# Patient Record
Sex: Male | Born: 1965 | Race: White | Hispanic: No | Marital: Married | Smoking: Never smoker
Health system: Southern US, Community
[De-identification: ages and names within clinical notes are randomized; demographics above are authoritative.]

## PROBLEM LIST (undated history)

## (undated) DIAGNOSIS — Z950 Presence of cardiac pacemaker: Secondary | ICD-10-CM

## (undated) DIAGNOSIS — N200 Calculus of kidney: Secondary | ICD-10-CM

## (undated) DIAGNOSIS — Z87442 Personal history of urinary calculi: Secondary | ICD-10-CM

## (undated) DIAGNOSIS — M545 Low back pain, unspecified: Secondary | ICD-10-CM

## (undated) DIAGNOSIS — Z8489 Family history of other specified conditions: Secondary | ICD-10-CM

## (undated) DIAGNOSIS — G8929 Other chronic pain: Secondary | ICD-10-CM

## (undated) DIAGNOSIS — R569 Unspecified convulsions: Secondary | ICD-10-CM

## (undated) DIAGNOSIS — R0609 Other forms of dyspnea: Secondary | ICD-10-CM

## (undated) DIAGNOSIS — R55 Syncope and collapse: Secondary | ICD-10-CM

## (undated) DIAGNOSIS — R0600 Dyspnea, unspecified: Secondary | ICD-10-CM

## (undated) DIAGNOSIS — R001 Bradycardia, unspecified: Secondary | ICD-10-CM

## (undated) HISTORY — DX: Other forms of dyspnea: R06.09

## (undated) HISTORY — DX: Presence of cardiac pacemaker: Z95.0

## (undated) HISTORY — DX: Unspecified convulsions: R56.9

## (undated) HISTORY — DX: Calculus of kidney: N20.0

## (undated) HISTORY — PX: BACK SURGERY: SHX140

## (undated) HISTORY — DX: Bradycardia, unspecified: R00.1

## (undated) HISTORY — DX: Syncope and collapse: R55

## (undated) HISTORY — PX: LUMBAR DISC SURGERY: SHX700

## (undated) HISTORY — DX: Dyspnea, unspecified: R06.00

## (undated) HISTORY — PX: TONSILLECTOMY AND ADENOIDECTOMY: SUR1326

---

## 2013-05-20 DIAGNOSIS — R209 Unspecified disturbances of skin sensation: Secondary | ICD-10-CM

## 2013-05-21 ENCOUNTER — Encounter: Payer: Self-pay | Admitting: Cardiology

## 2013-05-21 DIAGNOSIS — R079 Chest pain, unspecified: Secondary | ICD-10-CM

## 2013-07-07 ENCOUNTER — Encounter: Payer: Self-pay | Admitting: Pulmonary Disease

## 2013-07-07 ENCOUNTER — Ambulatory Visit (INDEPENDENT_AMBULATORY_CARE_PROVIDER_SITE_OTHER): Payer: Managed Care, Other (non HMO) | Admitting: Pulmonary Disease

## 2013-07-07 VITALS — BP 122/70 | HR 44 | Temp 97.0°F | Ht 69.0 in | Wt 154.2 lb

## 2013-07-07 DIAGNOSIS — R0609 Other forms of dyspnea: Secondary | ICD-10-CM

## 2013-07-07 DIAGNOSIS — R0600 Dyspnea, unspecified: Secondary | ICD-10-CM

## 2013-07-07 NOTE — Patient Instructions (Addendum)
Will schedule for breathing studies.   Will get records from [facility] regarding your recent hospitalization. followup with me the same day as breathing studies.

## 2013-07-07 NOTE — Progress Notes (Signed)
  Subjective:    Patient ID: John [name], male    DOB: 25-May-1966, 47 y.o.   MRN: [id number]  HPI The patient is a 47 year old male who I've been asked to see for dyspnea.  He tells me that he has had mild shortness of breath with activity in the past, but the last 3 months he has had progressive dyspnea on exertion that can now occur at rest.  He notes that he can become short of breath just talking at times.  He relates a one block dyspnea on exertion at a moderate pace on flat ground, and will get winded bringing groceries in from the car or carrying any type of loads.  He thinks he can walk up one flight of stairs without becoming winded.  The patient has never smoked, and has no history of asthma in the past.  He denies any cough, mucus, and no lower extremity edema.  He had a chest x-ray in August of this year that was completely normal, and has had no recent pulmonary function studies.  He did have a recent cardiac workup with what sounds like a chemical stress test.  He tells me that he had great difficulty getting his heart rate up, and then it would race and have a difficult time coming down.  He has never had a cardiac catheterization.  He describes proximal muscle weakness in his upper extremities, but has no difficulties with his lower extremities.  A recent TSH and hemoglobin are not available to me currently.   Review of Systems  Constitutional: Negative for fever and unexpected weight change.  HENT: Negative for ear pain, nosebleeds, congestion, sore throat, rhinorrhea, sneezing, trouble swallowing, dental problem, postnasal drip and sinus pressure.   Eyes: Negative for redness and itching.  Respiratory: Positive for shortness of breath. Negative for cough, chest tightness and wheezing.   Cardiovascular: Negative for palpitations and leg swelling.  Gastrointestinal: Negative for nausea and vomiting.  Genitourinary: Negative for dysuria.  Musculoskeletal: Negative for joint swelling.   Skin: Negative for rash.  Neurological: Negative for headaches.  Hematological: Does not bruise/bleed easily.  Psychiatric/Behavioral: Negative for dysphoric mood. The patient is not nervous/anxious.        Objective:   Physical Exam Constitutional:  Well developed, no acute distress  HENT:  Nares patent without discharge, septal deviation to the right  Oropharynx without exudate, palate and uvula are normal  Eyes:  Perrla, eomi, no scleral icterus  Neck:  No JVD, no TMG  Cardiovascular:  bradycardia, regular rhythm, no rubs or gallops.  No murmurs        Intact distal pulses  Pulmonary :  Normal breath sounds, no stridor or respiratory distress   No rales, rhonchi, or wheezing  Abdominal:  Soft, nondistended, bowel sounds present.  No tenderness noted.   Musculoskeletal:  No lower extremity edema noted.  Lymph Nodes:  No cervical lymphadenopathy noted  Skin:  No cyanosis noted  Neurologic:  Alert, appropriate, moves all 4 extremities without obvious deficit.         Assessment & Plan:

## 2013-07-08 NOTE — Assessment & Plan Note (Signed)
The patient is complaining of dyspnea with exertion and at rest, however there is nothing from a pulmonary standpoint that is obvious by history and exam today.  His lungs were totally clear, and his recent chest x-ray is normal.  He has never smoked, and has no history of asthma.  He will obviously need full pulmonary function studies for evaluation, and I will also check muscle pressures to evaluate for possible neuromuscular weakness.  My only other thought is whether he could have chronotropic incompetence with his significant bradycardia and difficulty increasing his heart rate during stress testing.  I will leave that to his primary physician to consider.

## 2013-07-12 ENCOUNTER — Encounter: Payer: Self-pay | Admitting: Pulmonary Disease

## 2013-07-12 ENCOUNTER — Ambulatory Visit (INDEPENDENT_AMBULATORY_CARE_PROVIDER_SITE_OTHER): Payer: Managed Care, Other (non HMO) | Admitting: Pulmonary Disease

## 2013-07-12 ENCOUNTER — Ambulatory Visit (HOSPITAL_COMMUNITY)
Admission: RE | Admit: 2013-07-12 | Discharge: 2013-07-12 | Disposition: A | Discharge status: Home / Self Care | Payer: Managed Care, Other (non HMO) | Source: Ambulatory Visit | Attending: Pulmonary Disease | Admitting: Pulmonary Disease

## 2013-07-12 VITALS — BP 100/60 | HR 54 | Temp 97.4°F | Ht 68.0 in | Wt 154.0 lb

## 2013-07-12 DIAGNOSIS — R0600 Dyspnea, unspecified: Secondary | ICD-10-CM

## 2013-07-12 DIAGNOSIS — R0609 Other forms of dyspnea: Secondary | ICD-10-CM | POA: Insufficient documentation

## 2013-07-12 DIAGNOSIS — R0989 Other specified symptoms and signs involving the circulatory and respiratory systems: Secondary | ICD-10-CM | POA: Insufficient documentation

## 2013-07-12 LAB — PULMONARY FUNCTION TEST

## 2013-07-12 MED ORDER — ALBUTEROL SULFATE (5 MG/ML) 0.5% IN NEBU
2.5000 mg | INHALATION_SOLUTION | Freq: Once | RESPIRATORY_TRACT | Status: AC
  Administered 2013-07-12: 2.5 mg via RESPIRATORY_TRACT

## 2013-07-12 NOTE — Assessment & Plan Note (Signed)
The patient continues to have significant dyspnea on exertion, but his chest x-ray, lung exam, and PFTs are all normal.  I remain concerned about the possibility of chronotropic incompetence, and we'll schedule him for a cardiopulmonary exercise test.  We'll also do muscle pressures, that were not done at the time of his pulmonary function studies.

## 2013-07-12 NOTE — Progress Notes (Signed)
  Subjective:    Patient ID: John [name], male    DOB: June 21, 1966, 47 y.o.   MRN: [id number]  HPI The patient comes in today for followup of his pulmonary function studies, done as part of a workup for dyspnea on exertion.  He was found to have no airflow obstruction, no restriction, and his diffusion capacity was normal.  He did have subtle air trapping on lung volumes.  He continues to have significant bradycardia.  I have had a long discussion with the patient about his PFTs, and answered all of his questions.   Review of Systems  Constitutional: Negative for fever and unexpected weight change.  HENT: Negative for ear pain, nosebleeds, congestion, sore throat, rhinorrhea, sneezing, trouble swallowing, dental problem, postnasal drip and sinus pressure.   Eyes: Negative for redness and itching.  Respiratory: Positive for cough, shortness of breath and wheezing. Negative for chest tightness.   Cardiovascular: Negative for palpitations and leg swelling.  Gastrointestinal: Negative for nausea and vomiting.  Genitourinary: Negative for dysuria.  Musculoskeletal: Negative for joint swelling.  Skin: Negative for rash.  Neurological: Negative for headaches.  Hematological: Does not bruise/bleed easily.  Psychiatric/Behavioral: Negative for dysphoric mood. The patient is not nervous/anxious.        Objective:   Physical Exam Thin male in no acute distress Nose without purulence or discharge noted Neck without lymphadenopathy or thyromegaly Lower extremities without edema, no cyanosis Alert and oriented, moves all 4 extremities.       Assessment & Plan:

## 2013-07-12 NOTE — Patient Instructions (Addendum)
Will schedule for muscle pressures.  I apologize these were not done with your breathing studies. Will schedule for cardiopulmonary exercise testing as we discussed.  Will call when those results are available.

## 2013-07-14 ENCOUNTER — Telehealth: Payer: Self-pay | Admitting: *Deleted

## 2013-07-14 NOTE — Telephone Encounter (Addendum)
Records requested have been received and placed in your green folder to review.

## 2013-07-20 ENCOUNTER — Institutional Professional Consult (permissible substitution): Payer: Managed Care, Other (non HMO) | Admitting: Pulmonary Disease

## 2013-07-21 ENCOUNTER — Ambulatory Visit (HOSPITAL_COMMUNITY): Payer: Managed Care, Other (non HMO) | Attending: Pulmonary Disease

## 2013-07-21 DIAGNOSIS — R0600 Dyspnea, unspecified: Secondary | ICD-10-CM

## 2013-07-21 DIAGNOSIS — R0989 Other specified symptoms and signs involving the circulatory and respiratory systems: Secondary | ICD-10-CM | POA: Insufficient documentation

## 2013-07-21 DIAGNOSIS — R0609 Other forms of dyspnea: Secondary | ICD-10-CM | POA: Insufficient documentation

## 2013-07-24 DIAGNOSIS — R0602 Shortness of breath: Secondary | ICD-10-CM

## 2013-07-26 ENCOUNTER — Other Ambulatory Visit: Payer: Self-pay | Admitting: Pulmonary Disease

## 2013-07-26 ENCOUNTER — Telehealth: Payer: Self-pay | Admitting: Pulmonary Disease

## 2013-07-26 DIAGNOSIS — R0600 Dyspnea, unspecified: Secondary | ICD-10-CM

## 2013-07-26 NOTE — Telephone Encounter (Signed)
PFT results with muscle pressures in green folder.

## 2013-07-26 NOTE — Telephone Encounter (Signed)
Pt aware of results.   Notes Recorded by Barbaraann Share, MD on 07/26/2013 at 10:58 AM Please let pt know that his exercise test did not show any significant pulmonary issue, but the question was raised whether his heart rate was adequate. It was a difficult test because he could not exercise vigorously. Will arrange for the pt to be seen by cardiology. Order put in for him to see Berton Mount. He will be called with this apptm.

## 2013-07-26 NOTE — Telephone Encounter (Signed)
John [name] and I are in the process of talking to John [name] at [facility] whom i spoke with earlier today stating that pt DID have MIP/MEP done.  I spoke with the patient and he states that he went back to [facility] last Wed and had these done again.  We are trying to figure out why they keep sending the previous tests and where the most recent MIP/MEP. Will check in the morning with John [name] and [facility]

## 2013-07-26 NOTE — Telephone Encounter (Signed)
John [name], I saw pfts but no muscle pressures.  This was the pt that we ordered both, but only got pfts.  Last visit, i put in order for muscle pressures only.  He was supposed to go back for these.  Did he ever go?  Thanks.

## 2013-07-28 NOTE — Telephone Encounter (Signed)
Spoke with Almyra Free-- Per Marcelino Duster they did not do MIP/MEP on patient bc they did not see the order placed for MIP/MEP.  Almyra Free has been working with me on trying to get this figured out with [place] at [employer]. Order is in fact in computer for MIP/MEP. Almyra Free is going to contact [place] at [place] in AM to find out what happened.   Will send to [facility] to follow up on. Will send to [facility] as FYI of status.

## 2013-08-02 ENCOUNTER — Encounter (HOSPITAL_COMMUNITY): Payer: Managed Care, Other (non HMO)

## 2013-08-04 ENCOUNTER — Ambulatory Visit (HOSPITAL_COMMUNITY)
Admission: RE | Admit: 2013-08-04 | Discharge: 2013-08-04 | Disposition: A | Discharge status: Home / Self Care | Payer: Managed Care, Other (non HMO) | Source: Ambulatory Visit | Attending: Pulmonary Disease | Admitting: Pulmonary Disease

## 2013-08-04 DIAGNOSIS — R0600 Dyspnea, unspecified: Secondary | ICD-10-CM

## 2013-08-04 DIAGNOSIS — R0989 Other specified symptoms and signs involving the circulatory and respiratory systems: Secondary | ICD-10-CM | POA: Insufficient documentation

## 2013-08-04 DIAGNOSIS — R0609 Other forms of dyspnea: Secondary | ICD-10-CM | POA: Insufficient documentation

## 2013-08-04 LAB — PULMONARY FUNCTION TEST

## 2013-08-06 ENCOUNTER — Encounter: Payer: Self-pay | Admitting: *Deleted

## 2013-08-06 ENCOUNTER — Telehealth: Payer: Self-pay | Admitting: Pulmonary Disease

## 2013-08-06 NOTE — Telephone Encounter (Signed)
Spoke with MW-- Per MW, Pt's lung pressure/muscle pressures are decreased--below normal range.  Patient advised that Dr Shelle Iron will review these results with him in more detail when he returns--Dr Shelle Iron will better be able to relate these results to patients health condition and explain where to go from here with treatment.   Will send to Dr Shelle Iron to follow up on once he returns. PFT MIP/MEP results are in Robbye Dede folder.

## 2013-08-06 NOTE — Telephone Encounter (Signed)
Results have been received. KC out of office until the first week of November.  Will ask Dr Sherene Sires to review PFT to give pt some prelim results until Dr Shelle Iron can follow up with patient when he returns.   Dr Sherene Sires, these PFT are in your look-at. Please advise on what to tell patient since patient requesting results--Dr Clance can review in more detail upon his return. Thanks!

## 2013-08-12 ENCOUNTER — Encounter: Payer: Self-pay | Admitting: Internal Medicine

## 2013-08-12 ENCOUNTER — Ambulatory Visit (INDEPENDENT_AMBULATORY_CARE_PROVIDER_SITE_OTHER): Payer: Managed Care, Other (non HMO) | Admitting: Internal Medicine

## 2013-08-12 VITALS — BP 92/60 | HR 44 | Ht 69.0 in | Wt 156.0 lb

## 2013-08-12 DIAGNOSIS — R001 Bradycardia, unspecified: Secondary | ICD-10-CM

## 2013-08-12 DIAGNOSIS — I4589 Other specified conduction disorders: Secondary | ICD-10-CM

## 2013-08-12 DIAGNOSIS — R0609 Other forms of dyspnea: Secondary | ICD-10-CM

## 2013-08-12 DIAGNOSIS — I498 Other specified cardiac arrhythmias: Secondary | ICD-10-CM

## 2013-08-12 DIAGNOSIS — R0600 Dyspnea, unspecified: Secondary | ICD-10-CM

## 2013-08-12 HISTORY — DX: Bradycardia, unspecified: R00.1

## 2013-08-12 NOTE — Assessment & Plan Note (Signed)
As above.

## 2013-08-12 NOTE — Patient Instructions (Addendum)
Your physician recommends that you continue on your current medications as directed. Please refer to the Current Medication list given to you today.  Please call us next Thursday [place], [place] [phone]  We will determine follow up as we gather information from doctors

## 2013-08-12 NOTE — Progress Notes (Signed)
 ELECTROPHYSIOLOGY CONSULT NOTE  Patient ID: John [name], MRN: [id number], DOB/AGE: 01/13/1966 47 y.o. Admit date: (Not on file) Date of Consult: 08/12/2013  Primary Physician: SASSER,PAUL W, MD Primary Cardiologist:  new  Chief Complaint: edyspnea on exertion   HPI John [name] is a 47 y.o. male   Is seen for dyspnea on exertion. Workup prompted a cardiopulmonary stress test where his peak heart rate was 85 representing only 50% of predicted maximal heart rate.  His dyspnea has been a problem for 6-8 months and has been progressive. It has been accompanied by some chest discomfort.. he has had 2 stress tests [facility]. Apparently they were normal.    He is also been diagnosed with Partridge  Turner syndrome as well as possibly a neuromuscular weakness to his breathing. I tried to get up with Dr. of which will be KC about this and he will get back to me when he returns to [place]      Past Medical History  Diagnosis Date  . Sinus bradycardia 08/12/2013      Surgical History:  Past Surgical History  Procedure Laterality Date  . Back surgery  1992-94    ruptured disc     Home Meds: Prior to Admission medications   Medication Sig Start Date End Date Taking? Authorizing Provider  gabapentin (NEURONTIN) 300 MG capsule 1 capsule 3 (three) times daily. 05/28/13  Yes Historical Provider, MD      Allergies:  Allergies  Allergen Reactions  . Penicillins     RASH    History   Social History  . Marital Status: Single    Spouse Name: N/A    Number of Children: 2  . Years of Education: N/A   Occupational History  . material handler    Social History Main Topics  . Smoking status: Never Smoker   . Smokeless tobacco: Never Used  . Alcohol Use: Yes     Comment: social   . Drug Use: No  . Sexual Activity: Not on file   Other Topics Concern  . Not on file   Social History Narrative  . No narrative on file     Family History  Problem Relation  Age of Onset  . Diabetes Father   . Diabetes Mother      ROS:  Please see the history of present illness.     All other systems reviewed and negative.    Physical Exam:   Blood pressure 92/60, pulse 44, height 5' 9" (1.753 m), weight 156 lb (70.761 kg), SpO2 99.00%. General: Well developed, well nourished male in no acute distress. Head: Normocephalic, atraumatic, sclera non-icteric, no xanthomas, nares are without discharge. EENT: normal Lymph Nodes:  none Back: without scoliosis/kyphosis , no CVA tendersness Neck: Negative for carotid bruits. JVD not elevated. Lungs: Clear bilaterally to auscultation without wheezes, rales, or rhonchi. Breathing is unlabored. Heart: RRR with S1 S2. 2/6 systolic  murmur , rubs, or gallops appreciated. Abdomen: Soft, non-tender, non-distended with normoactive bowel sounds. No hepatomegaly. No rebound/guarding. No obvious abdominal masses. Msk:  Strength and tone appear normal for age. Extremities: No clubbing or cyanosis. No edema.  Distal pedal pulses are 2+ and equal bilaterally. Skin: Warm and Dry Neuro: Alert and oriented X 3. CN III-XII intact Grossly normal sensory and motor function . Psych:  Responds to questions appropriately with a normal affect.      Labs: Cardiac Enzymes No results found for this basename: CKTOTAL, CKMB, TROPONINI,  in the last   72 hours CBC No results found for this basename: WBC,  HGB,  HCT,  MCV,  PLT   PROTIME: No results found for this basename: LABPROT, INR,  in the last 72 hours Chemistry No results found for this basename: NA, K, CL, CO2, BUN, CREATININE, CALCIUM, LABALBU, PROT, BILITOT, ALKPHOS, ALT, AST, GLUCOSE,  in the last 168 hours Lipids No results found for this basename: CHOL,  HDL,  LDLCALC,  TRIG   BNP No results found for this basename: probnp   Miscellaneous No results found for this basename: DDIMER    Radiology/Studies:  No results found.  EKG:  Sinus and 44 Intervals  15/08/41   Assessment and Plan:    John [name]   

## 2013-08-12 NOTE — Assessment & Plan Note (Signed)
Pt has chronotropic incompetence which may be the explanation of his dyspnea.  This is intriguing as he has a 49 year old paternal cousin with a pacer and a paternal aunt also with pacemaker  The curious thing is that this is occurring in the context of a diaphragmatic muscle weakness issue as well as modest hypotension both of which suggest the possibility of a primary systemic disorder in which the bradycardia is simply a manifestation. Hence, prior to pacing, this needs to be explored. I discussed this with Dr. Shelle Iron and he will be exploring this next week.

## 2013-08-12 NOTE — Assessment & Plan Note (Signed)
The patient has significant sinus bradycardia and chronotropic incompetence. This is likely contributing in a major way to his effort intolerance. The intriguing aspects of this are 2. #1-he has a paternal cousin and a paternal aunt both of whom have pacemakers suggesting the possibility of an underlying familial genetic disorder. There is also hypotension and this issue of diaphragmatic weakness suggesting the possibility of a systemic process of which bradycardia is simply a manifestation.  Hence, prior to proceeding with definitive therapy for bradycardia, it becomes incumbent upon [place] to exclude systemic disease. We will obtain the records to [facility] and look for possible metabolic issues and will await Dr. Teddy Spike evaluation of the muscle weakness problem  The relationship to the Partridge-Turner syndrome seems harder to connect.

## 2013-08-16 ENCOUNTER — Encounter: Payer: Self-pay | Admitting: Pulmonary Disease

## 2013-08-16 NOTE — Telephone Encounter (Signed)
Please let pt know that his muscle pressures are low, which may indicate weak diaphragms, although can also be nonspecific. Let him know I have spoken to Dr. Graciela Husbands, and would like to check some bloodwork looking for autoimmune disease before Dr. Graciela Husbands checks more invasive testing.  Have him come by lab, and send my usual panel.

## 2013-08-16 NOTE — Telephone Encounter (Signed)
I spoke with pt. I advised him KC has been seeing pt's and most likely has not had time to review his results. I advised pt will call once he does. Please advise KC thanks

## 2013-08-16 NOTE — Telephone Encounter (Signed)
Pt is asking to speak w/ [facility] or his nurse & can be reached at [phone].  Antionette Fairy

## 2013-08-17 NOTE — Telephone Encounter (Signed)
Spoke w/pt wife-- Aware of results per KC Pt to come by lab today for testing. Autoimmune panel placed.

## 2013-08-18 ENCOUNTER — Other Ambulatory Visit (INDEPENDENT_AMBULATORY_CARE_PROVIDER_SITE_OTHER): Payer: Managed Care, Other (non HMO)

## 2013-08-18 DIAGNOSIS — R0609 Other forms of dyspnea: Secondary | ICD-10-CM

## 2013-08-18 LAB — RHEUMATOID FACTOR: Rhuematoid fact SerPl-aCnc: <10 IU/mL (ref <=14)

## 2013-08-18 LAB — C-REACTIVE PROTEIN: CRP: 0.5 mg/dL (ref 0.5–20.0)

## 2013-08-19 ENCOUNTER — Telehealth: Payer: Self-pay | Admitting: Internal Medicine

## 2013-08-19 LAB — ANTI-SCLERODERMA ANTIBODY: Scleroderma (Scl-70) (ENA) Antibody, IgG: 1 AU/mL (ref <30)

## 2013-08-19 LAB — CK TOTAL AND CKMB (NOT AT ARMC)

## 2013-08-19 LAB — JO-1 ANTIBODY-IGG: Jo-1 Antibody, IgG: <1 AU/mL (ref <30)

## 2013-08-19 NOTE — Telephone Encounter (Signed)
New message    Saw Dr Graciela Husbands on 10-30.  He said he was to call today and see what Dr Graciela Husbands wanted to do next.  Breathing is worse.

## 2013-08-19 NOTE — Telephone Encounter (Signed)
Follow up    Have not heard from nurse.   Want to know what the next step is going to be

## 2013-08-19 NOTE — Telephone Encounter (Signed)
Follow up    Pt calling and would like a call back please about the next step.  Breathing is getting worse.

## 2013-08-20 ENCOUNTER — Encounter (HOSPITAL_COMMUNITY): Payer: Self-pay | Admitting: Pharmacy Technician

## 2013-08-20 ENCOUNTER — Other Ambulatory Visit: Payer: Self-pay | Admitting: *Deleted

## 2013-08-20 ENCOUNTER — Other Ambulatory Visit (INDEPENDENT_AMBULATORY_CARE_PROVIDER_SITE_OTHER): Payer: Managed Care, Other (non HMO)

## 2013-08-20 ENCOUNTER — Encounter: Payer: Self-pay | Admitting: *Deleted

## 2013-08-20 DIAGNOSIS — I4589 Other specified conduction disorders: Secondary | ICD-10-CM

## 2013-08-20 LAB — CBC WITH DIFFERENTIAL/PLATELET
Basophils Absolute: 0 10*3/uL (ref 0.0–0.1)
Basophils Relative: 0.4 % (ref 0.0–3.0)
Eosinophils Absolute: 0.2 10*3/uL (ref 0.0–0.7)
Eosinophils Relative: 3.1 % (ref 0.0–5.0)
HCT: 39.9 % (ref 39.0–52.0)
Hemoglobin: 13.8 g/dL (ref 13.0–17.0)
Lymphocytes Relative: 35.6 % (ref 12.0–46.0)
Lymphs Abs: 2.4 10*3/uL (ref 0.7–4.0)
MCHC: 34.5 g/dL (ref 30.0–36.0)
MCV: 81.5 fl (ref 78.0–100.0)
Monocytes Absolute: 0.5 10*3/uL (ref 0.1–1.0)
Monocytes Relative: 7 % (ref 3.0–12.0)
Neutro Abs: 3.7 10*3/uL (ref 1.4–7.7)
Neutrophils Relative %: 53.9 % (ref 43.0–77.0)
Platelets: 236 10*3/uL (ref 150.0–400.0)
RBC: 4.9 Mil/uL (ref 4.22–5.81)
RDW: 13.1 % (ref 11.5–14.6)
WBC: 6.8 10*3/uL (ref 4.5–10.5)

## 2013-08-20 LAB — BASIC METABOLIC PANEL
Calcium: 8.9 mg/dL (ref 8.4–10.5)
Creatinine, Ser: 1 mg/dL (ref 0.4–1.5)
GFR: 83.16 mL/min (ref >60.00)
Sodium: 140 mEq/L (ref 135–145)

## 2013-08-20 NOTE — Telephone Encounter (Signed)
Follow Up:  Pt states he is still waiting for the nurse to call him.

## 2013-08-20 NOTE — Telephone Encounter (Signed)
Advised patient John [name] recommends to proceed with PPM after reviewing records. Patient scheduled for Biotronik CLS device with Dr. Johney Frame on 08/24/13 at 12:00. Lab work today and letter of instructions left at front desk for him to pick up. Patient verbalized understanding and agreeable to plan.

## 2013-08-21 LAB — RNP ANTIBODY: Ribonucleic Protein(ENA) Antibody, IgG: 1.4 AI — AB

## 2013-08-23 ENCOUNTER — Other Ambulatory Visit: Payer: Self-pay | Admitting: *Deleted

## 2013-08-23 DIAGNOSIS — R001 Bradycardia, unspecified: Secondary | ICD-10-CM

## 2013-08-24 ENCOUNTER — Ambulatory Visit (HOSPITAL_COMMUNITY)
Admission: RE | Admit: 2013-08-24 | Discharge: 2013-08-25 | Disposition: A | Discharge status: Home / Self Care | Payer: Managed Care, Other (non HMO) | Source: Ambulatory Visit | Attending: Internal Medicine | Admitting: Internal Medicine

## 2013-08-24 ENCOUNTER — Encounter (HOSPITAL_COMMUNITY)
Admission: RE | Disposition: A | Discharge status: Home / Self Care | Payer: Self-pay | Source: Ambulatory Visit | Attending: Internal Medicine

## 2013-08-24 ENCOUNTER — Encounter (HOSPITAL_COMMUNITY): Payer: Self-pay | Admitting: General Practice

## 2013-08-24 DIAGNOSIS — I4589 Other specified conduction disorders: Secondary | ICD-10-CM | POA: Insufficient documentation

## 2013-08-24 DIAGNOSIS — R001 Bradycardia, unspecified: Secondary | ICD-10-CM

## 2013-08-24 DIAGNOSIS — G545 Neuralgic amyotrophy: Secondary | ICD-10-CM | POA: Insufficient documentation

## 2013-08-24 DIAGNOSIS — I498 Other specified cardiac arrhythmias: Secondary | ICD-10-CM | POA: Insufficient documentation

## 2013-08-24 DIAGNOSIS — I495 Sick sinus syndrome: Secondary | ICD-10-CM

## 2013-08-24 HISTORY — PX: PERMANENT PACEMAKER INSERTION: SHX5480

## 2013-08-24 HISTORY — PX: PACEMAKER INSERTION: SHX728

## 2013-08-24 HISTORY — DX: Low back pain: M54.5

## 2013-08-24 HISTORY — DX: Family history of other specified conditions: Z84.89

## 2013-08-24 HISTORY — DX: Other chronic pain: G89.29

## 2013-08-24 HISTORY — DX: Low back pain, unspecified: M54.50

## 2013-08-24 LAB — BASIC METABOLIC PANEL
Creatinine, Ser: 0.9 mg/dL (ref 0.50–1.35)
GFR calc Af Amer: >90 mL/min (ref >90)
GFR calc non Af Amer: >90 mL/min (ref >90)
Glucose, Bld: 95 mg/dL (ref 70–99)
Potassium: 3.8 mEq/L (ref 3.5–5.1)
Sodium: 139 mEq/L (ref 135–145)

## 2013-08-24 LAB — SURGICAL PCR SCREEN: Staphylococcus aureus: NEGATIVE

## 2013-08-24 SURGERY — PERMANENT PACEMAKER INSERTION

## 2013-08-24 MED ORDER — YOU HAVE A PACEMAKER BOOK
Freq: Once | Status: AC
  Administered 2013-08-24: 22:00:00
  Filled 2013-08-24: qty 1

## 2013-08-24 MED ORDER — SODIUM CHLORIDE 0.9 % IR SOLN
80.0000 mg | Status: DC
  Filled 2013-08-24: qty 2

## 2013-08-24 MED ORDER — ONDANSETRON HCL 4 MG/2ML IJ SOLN
INTRAMUSCULAR | Status: AC
  Filled 2013-08-24: qty 2

## 2013-08-24 MED ORDER — CHLORHEXIDINE GLUCONATE 4 % EX LIQD
60.0000 mL | Freq: Once | CUTANEOUS | Status: DC
  Filled 2013-08-24: qty 60

## 2013-08-24 MED ORDER — GABAPENTIN 300 MG PO CAPS
300.0000 mg | ORAL_CAPSULE | Freq: Four times a day (QID) | ORAL | Status: DC
  Administered 2013-08-24 – 2013-08-25 (×3): 300 mg via ORAL
  Filled 2013-08-24 (×6): qty 1

## 2013-08-24 MED ORDER — MUPIROCIN 2 % EX OINT
TOPICAL_OINTMENT | Freq: Two times a day (BID) | CUTANEOUS | Status: DC
  Administered 2013-08-24: 1 via NASAL
  Filled 2013-08-24: qty 22

## 2013-08-24 MED ORDER — SODIUM CHLORIDE 0.9 % IV SOLN
INTRAVENOUS | Status: DC
  Administered 2013-08-24: 11:00:00 via INTRAVENOUS

## 2013-08-24 MED ORDER — ACETAMINOPHEN 325 MG PO TABS
325.0000 mg | ORAL_TABLET | ORAL | Status: DC | PRN
  Administered 2013-08-24: 650 mg via ORAL
  Filled 2013-08-24: qty 2

## 2013-08-24 MED ORDER — HEPARIN (PORCINE) IN NACL 2-0.9 UNIT/ML-% IJ SOLN
INTRAMUSCULAR | Status: AC
  Filled 2013-08-24: qty 500

## 2013-08-24 MED ORDER — LIDOCAINE HCL (PF) 1 % IJ SOLN
INTRAMUSCULAR | Status: AC
  Filled 2013-08-24: qty 60

## 2013-08-24 MED ORDER — VANCOMYCIN HCL IN DEXTROSE 1-5 GM/200ML-% IV SOLN
1000.0000 mg | Freq: Two times a day (BID) | INTRAVENOUS | Status: AC
  Administered 2013-08-25: 02:00:00 1000 mg via INTRAVENOUS
  Filled 2013-08-24: qty 200

## 2013-08-24 MED ORDER — SODIUM CHLORIDE 0.9 % IV SOLN: 250.0000 mL | INTRAVENOUS | Status: DC | PRN

## 2013-08-24 MED ORDER — ONDANSETRON HCL 4 MG/2ML IJ SOLN: 4.0000 mg | Freq: Four times a day (QID) | INTRAMUSCULAR | Status: DC | PRN

## 2013-08-24 MED ORDER — SODIUM CHLORIDE 0.9 % IJ SOLN: 3.0000 mL | Freq: Two times a day (BID) | INTRAMUSCULAR | Status: DC

## 2013-08-24 MED ORDER — SODIUM CHLORIDE 0.9 % IJ SOLN: 3.0000 mL | INTRAMUSCULAR | Status: DC | PRN

## 2013-08-24 MED ORDER — MUPIROCIN 2 % EX OINT
TOPICAL_OINTMENT | CUTANEOUS | Status: AC
  Administered 2013-08-24: 1 via NASAL
  Filled 2013-08-24: qty 22

## 2013-08-24 MED ORDER — FENTANYL CITRATE 0.05 MG/ML IJ SOLN
INTRAMUSCULAR | Status: AC
  Filled 2013-08-24: qty 2

## 2013-08-24 MED ORDER — VANCOMYCIN HCL IN DEXTROSE 1-5 GM/200ML-% IV SOLN
1000.0000 mg | INTRAVENOUS | Status: DC
  Filled 2013-08-24 (×2): qty 200

## 2013-08-24 MED ORDER — MIDAZOLAM HCL 5 MG/5ML IJ SOLN
INTRAMUSCULAR | Status: AC
  Filled 2013-08-24: qty 5

## 2013-08-24 NOTE — Discharge Summary (Signed)
ELECTROPHYSIOLOGY PROCEDURE DISCHARGE SUMMARY    [name] ID: John [name],  MRN: [id number], DOB/AGE: September 29, 1966 47 y.o.  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 Codeine Nausea And Vomiting  . Morphine And Related Nausea And Vomiting  . Penicillins Hives    RASH   Procedures This Admission:  1.  Implantation of a permanent pacemaker on 08-24-2013 by Dr Johney Frame. John [name] received a BTK Evera DR pacemaker with Setrox-S right atrial and right ventricular leads. There were no early apparent complications.  2.  CXR on 08-25-2013 demonstrated stable lead placement without pneumothorax.  Brief HPI: John [name] is a 47 y.o. male with a history of symptomatic sinus bradycardia and chronotropic incompetence who presented yesterday for pacemaker implantation. He has had intermittent, exertional episodes of shortness of breath and fatigue over John past few months. He had a CPX which revealed chronotropic incompetence. No reversible causes have been identified. He was evaluated by Dr Graciela Husbands who recommended pacemaker implantation. He therefore presented yesterday for pacemaker implantation.   Hospital Course:  John [name] was admitted and underwent implantation of a Biotronik dual chamber pacemaker with details as outlined above.   He was monitored on telemetry overnight which demonstrated A paced V sensed rhythm, no arrhythmias. Left chest was without hematoma or bleeding. John device was interrogated and found to be functioning normally. CXR was obtained and demonstrated no pneumothorax status post device implantation. Wound care, arm mobility, and restrictions were  reviewed with John [name]. Dr Ladona Ridgel examined John [name] and considered him stable for discharge to home today.   Discharge Vitals: Blood pressure 104/65, pulse 70, temperature 98.2 F (36.8 C), temperature source Oral, resp. rate 20, height 5\' 9"  (1.753 m), weight 155 lb 10.3 oz (70.6 kg), SpO2 97.00%.    Labs: Lab Results  Component Value Date   WBC 6.8 08/20/2013   HGB 13.8 08/20/2013   HCT 39.9 08/20/2013   MCV 81.5 08/20/2013   PLT 236.0 08/20/2013    Recent Labs Lab 08/24/13 1035  NA 139  K 3.8  CL 103  CO2 28  BUN 12  CREATININE 0.90  CALCIUM 9.0  GLUCOSE 95   Lab Results  Component Value Date   CKTOTAL 112 08/18/2013   CKMB <0.7 08/18/2013     Discharge Medications:    Medication List         gabapentin 300 MG capsule  Commonly known as:  NEURONTIN  Take 1 capsule by mouth 4 (four) times daily.       Disposition:   Future Appointments Provider Department Dept Phone   09/08/2013 11:30 AM [facility] [place] Office [phone]   11/23/2013 2:00 PM Duke Salvia, MD [facility] [place] Office [phone]     Follow-up Information   Follow up with [facility] Office On 09/08/2013. (At 11:30 AM for wound check)    Specialty:  Cardiology   Contact information:   [address] [place] [address] [phone]      Follow up with Sherryl Manges, MD On 11/23/2013. (At 2:00 PM)    Specialty:  Cardiology   Contact information:   [address] [place] [address] [phone]  Duration of Discharge Encounter: Greater than 30 minutes including physician time.  Signed, Rick Duff, MMS, PA-C 08/25/2013 7:22 AM  EP Attending  [name] seen and examined. Agree with above exam, assessment and plan.  Leonia Reeves.D.

## 2013-08-24 NOTE — Op Note (Addendum)
SURGEON:  Hillis Range, MD     PREPROCEDURE DIAGNOSIS:  Symptomatic sinus bradycardia, chronotropic incompetence    POSTPROCEDURE DIAGNOSIS:  Symptomatic sinus bradycardia, chronotropic incompetence     PROCEDURES:   1. Pacemaker implantation.     INTRODUCTION:  John [name] is a 47 y.o. male with a history of symptomatic sinus bradycardia and chronotropic incompetence who presents today for pacemaker implantation.  The patient reports intermittent episodes of shortness of breath and fatigue over the past few months.  No reversible causes have been identified.  He had a CPX which revealed chronotropic incompetence.  He has been evaluated by Dr Graciela Husbands who recommends pacemaker implantation.  The patient therefore presents today for pacemaker implantation.     DESCRIPTION OF PROCEDURE:  Informed written consent was obtained, and  the patient was brought to the electrophysiology lab in a fasting state.  The patient received IV Versed and Fentanyl as sedation for the procedure today.  The patients left chest was prepped and draped in the usual sterile fashion by the EP lab staff. The skin overlying the left deltopectoral region was infiltrated with lidocaine for local analgesia.  A 4-cm incision was made over the left deltopectoral region.  A left subcutaneous pacemaker pocket was fashioned using a combination of sharp and blunt dissection. Electrocautery was required to assure hemostasis.    RA/RV Lead Placement: The left axillary vein was therefore cannulated.  Through the left axillary vein, a Biotronik Setrox S-45 (serial number  [id number]) right atrial lead and a Biotronik Setrox S-60 (serial number  [id number]) right ventricular lead were advanced with fluoroscopic visualization into the right atrial appendage and right ventricular apex positions respectively.  Initial atrial lead P- waves measured 3.8 mV with impedance of 631 ohms and a threshold of 1.1 V at 0.5 msec.  Right ventricular lead R-waves  measured 15 mV with an impedance of 842 ohms and a threshold of 0.8 V at 0.5 msec.  Both leads were secured to the pectoralis fascia using #2-0 silk over the suture sleeves.   Device Placement:  The leads were then connected to a [employer] DR-T (serial number  [id number] ) pacemaker.  The pocket was irrigated with copious gentamicin solution.  The pacemaker was then placed into the pocket.  The pocket was then closed in 2 layers with 2.0 Vicryl suture for the subcutaneous and subcuticular layers.  Steri-  Strips and a sterile dressing were then applied.  There were no early apparent complications.     CONCLUSIONS:   1. Successful implantation of a Biotronik Evia DR-T dual-chamber pacemaker for symptomatic sinus bradycardia and chronotropic incompletence  2. No early apparent complications.           Hillis Range, MD 08/24/2013 2:55 PM

## 2013-08-24 NOTE — H&P (View-Only) (Signed)
ELECTROPHYSIOLOGY CONSULT NOTE  Patient ID: John [name], MRN: [id number], DOB/AGE: Feb 03, 1966 47 y.o. Admit date: (Not on file) Date of Consult: 08/12/2013  Primary Physician: Estanislado Pandy, MD Primary Cardiologist:  new  Chief Complaint: edyspnea on exertion   HPI John [name] is a 47 y.o. male   Is seen for dyspnea on exertion. Workup prompted a cardiopulmonary stress test where his peak heart rate was 85 representing only 50% of predicted maximal heart rate.  His dyspnea has been a problem for 6-8 months and has been progressive. It has been accompanied by some chest discomfort.. he has had 2 stress tests [facility]. Apparently they were normal.    He is also been diagnosed with Bing Quarry syndrome as well as possibly a neuromuscular weakness to his breathing. I tried to get up with Dr. of which will be [facility] about this and he will get back to me when he returns to [facility]      Past Medical History  Diagnosis Date  . Sinus bradycardia 08/12/2013      Surgical History:  Past Surgical History  Procedure Laterality Date  . Back surgery  1992-94    ruptured disc     Home Meds: Prior to Admission medications   Medication Sig Start Date End Date Taking? Authorizing Provider  gabapentin (NEURONTIN) 300 MG capsule 1 capsule 3 (three) times daily. 05/28/13  Yes Historical Provider, MD      Allergies:  Allergies  Allergen Reactions  . Penicillins     RASH    History   Social History  . Marital Status: Single    Spouse Name: N/A    Number of Children: 2  . Years of Education: N/A   Occupational History  . material handler    Social History Main Topics  . Smoking status: Never Smoker   . Smokeless tobacco: Never Used  . Alcohol Use: Yes     Comment: social   . Drug Use: No  . Sexual Activity: Not on file   Other Topics Concern  . Not on file   Social History Narrative  . No narrative on file     Family History  Problem Relation  Age of Onset  . Diabetes Father   . Diabetes Mother      ROS:  Please see the history of present illness.     All other systems reviewed and negative.    Physical Exam:   Blood pressure 92/60, pulse 44, height 5\' 9"  (1.753 m), weight 156 lb (70.761 kg), SpO2 99.00%. General: Well developed, well nourished male in no acute distress. Head: Normocephalic, atraumatic, sclera non-icteric, no xanthomas, nares are without discharge. EENT: normal Lymph Nodes:  none Back: without scoliosis/kyphosis , no CVA tendersness Neck: Negative for carotid bruits. JVD not elevated. Lungs: Clear bilaterally to auscultation without wheezes, rales, or rhonchi. Breathing is unlabored. Heart: RRR with S1 S2. 2/6 systolic  murmur , rubs, or gallops appreciated. Abdomen: Soft, non-tender, non-distended with normoactive bowel sounds. No hepatomegaly. No rebound/guarding. No obvious abdominal masses. Msk:  Strength and tone appear normal for age. Extremities: No clubbing or cyanosis. No edema.  Distal pedal pulses are 2+ and equal bilaterally. Skin: Warm and Dry Neuro: Alert and oriented X 3. CN III-XII intact Grossly normal sensory and motor function . Psych:  Responds to questions appropriately with a normal affect.      Labs: Cardiac Enzymes No results found for this basename: CKTOTAL, CKMB, TROPONINI,  in the last  72 hours CBC No results found for this basename: WBC,  HGB,  HCT,  MCV,  PLT   PROTIME: No results found for this basename: LABPROT, INR,  in the last 72 hours Chemistry No results found for this basename: NA, K, CL, CO2, BUN, CREATININE, CALCIUM, LABALBU, PROT, BILITOT, ALKPHOS, ALT, AST, GLUCOSE,  in the last 168 hours Lipids No results found for this basename: CHOL,  HDL,  LDLCALC,  TRIG   BNP No results found for this basename: probnp   Miscellaneous No results found for this basename: DDIMER    Radiology/Studies:  No results found.  EKG:  Sinus and 44 Intervals  15/08/41   Assessment and Plan:    Sherryl Manges

## 2013-08-24 NOTE — Interval H&P Note (Signed)
History and Physical Interval Note:  08/24/2013 11:38 AM  John [name]  has presented today for surgery, with the diagnosis of bradicardia  The various methods of treatment have been discussed with the patient and family. After consideration of risks, benefits and other options for treatment, the patient has consented to  Procedure(s): PERMANENT PACEMAKER INSERTION (N/A) as a surgical intervention .  The patient's history has been reviewed, patient examined, no change in status, stable for surgery.  I have reviewed the patient's chart and labs.  Questions were answered to the patient's satisfaction.     The patient has symptomatic sinus bradycardia.  I would therefore recommend pacemaker implantation at this time.  Risks, benefits, alternatives to pacemaker implantation were discussed in detail with the patient today. The patient understands that the risks include but are not limited to bleeding, infection, pneumothorax, perforation, tamponade, vascular damage, renal failure, MI, stroke, death,  and lead dislodgement and wishes to proceed. We will therefore schedule the procedure at this time.    Hillis Range

## 2013-08-25 ENCOUNTER — Ambulatory Visit (HOSPITAL_COMMUNITY): Payer: Managed Care, Other (non HMO)

## 2013-08-25 DIAGNOSIS — I495 Sick sinus syndrome: Secondary | ICD-10-CM

## 2013-09-08 ENCOUNTER — Ambulatory Visit (INDEPENDENT_AMBULATORY_CARE_PROVIDER_SITE_OTHER): Payer: Managed Care, Other (non HMO) | Admitting: *Deleted

## 2013-09-08 ENCOUNTER — Encounter: Payer: Self-pay | Admitting: Internal Medicine

## 2013-09-08 DIAGNOSIS — R001 Bradycardia, unspecified: Secondary | ICD-10-CM

## 2013-09-08 DIAGNOSIS — Z950 Presence of cardiac pacemaker: Secondary | ICD-10-CM

## 2013-09-08 DIAGNOSIS — I498 Other specified cardiac arrhythmias: Secondary | ICD-10-CM

## 2013-09-08 LAB — MDC_IDC_ENUM_SESS_TYPE_INCLINIC
Battery Remaining Longevity: 106 mo
Brady Statistic RV Percent Paced: 0 %
Implantable Pulse Generator Model: 359529
Lead Channel Impedance Value: 487 Ohm
Lead Channel Impedance Value: 585 Ohm
Lead Channel Pacing Threshold Amplitude: 0.8 V
Lead Channel Pacing Threshold Pulse Width: 0.4 ms
Lead Channel Pacing Threshold Pulse Width: 0.4 ms
Lead Channel Sensing Intrinsic Amplitude: 3.5 mV
Lead Channel Setting Pacing Amplitude: 3 V

## 2013-09-08 NOTE — Progress Notes (Signed)
Pt seen in device clinic for follow up of recently implanted pacemaker.  Wound well healed.  No redness, swelling, or edema.  Steri-strips removed today.   Device interrogated and found to be functioning normally.  No changes made today. See PaceArt for full details.  Pt denies chest pain, shortness of breath, palpitations, or dizziness.  Pt to follow up with Dr. Graciela Husbands 11/23/13 @ 2:00  Merrill Deanda 09/08/2013 11:41 AM

## 2013-10-18 ENCOUNTER — Telehealth: Payer: Self-pay | Admitting: Internal Medicine

## 2013-10-18 NOTE — Telephone Encounter (Signed)
Follow Up: ° °Pt is requesting a call back ° ° ° °

## 2013-10-18 NOTE — Telephone Encounter (Signed)
Spoke with patient, he states on 10/14/13 during the night he woke and felt a shock from his pacemaker.  I explained that pacemaker don't deliver shocks and it could be just healing process from implant in November.  B[employer]ron[employer] home monitoring site did not post any alerts.

## 2013-10-18 NOTE — Telephone Encounter (Signed)
New Problem:  Pt states he felt a shock from his pacemaker on 10/14/13 or 10/15/13.John [name]. Pt would like a call back letting him know what that was and if he is ok.

## 2013-10-25 ENCOUNTER — Ambulatory Visit (INDEPENDENT_AMBULATORY_CARE_PROVIDER_SITE_OTHER): Payer: Managed Care, Other (non HMO) | Admitting: Internal Medicine

## 2013-10-25 ENCOUNTER — Encounter: Payer: Self-pay | Admitting: Internal Medicine

## 2013-10-25 VITALS — BP 120/79 | HR 75 | Ht 69.0 in | Wt 165.8 lb

## 2013-10-25 DIAGNOSIS — R55 Syncope and collapse: Secondary | ICD-10-CM | POA: Insufficient documentation

## 2013-10-25 DIAGNOSIS — I498 Other specified cardiac arrhythmias: Secondary | ICD-10-CM

## 2013-10-25 DIAGNOSIS — Z950 Presence of cardiac pacemaker: Secondary | ICD-10-CM | POA: Insufficient documentation

## 2013-10-25 DIAGNOSIS — R001 Bradycardia, unspecified: Secondary | ICD-10-CM

## 2013-10-25 DIAGNOSIS — I4589 Other specified conduction disorders: Secondary | ICD-10-CM

## 2013-10-25 LAB — MDC_IDC_ENUM_SESS_TYPE_INCLINIC
Implantable Pulse Generator Serial Number: 68069787
Lead Channel Impedance Value: 546 Ohm
Lead Channel Pacing Threshold Amplitude: 0.7 V
Lead Channel Pacing Threshold Pulse Width: 0.4 ms
Lead Channel Sensing Intrinsic Amplitude: 4 mV
Lead Channel Setting Pacing Amplitude: 1.7 V
Lead Channel Setting Pacing Amplitude: 1.9 V
MDC IDC MSMT LEADCHNL RA PACING THRESHOLD AMPLITUDE: 0.7 V
MDC IDC MSMT LEADCHNL RA PACING THRESHOLD PULSEWIDTH: 0.4 ms
MDC IDC MSMT LEADCHNL RV IMPEDANCE VALUE: 682 Ohm
MDC IDC MSMT LEADCHNL RV SENSING INTR AMPL: 13 mV
MDC IDC PG MODEL: 359529
MDC IDC SESS DTM: 20150112094113
MDC IDC SET LEADCHNL RV PACING PULSEWIDTH: 0.4 ms

## 2013-10-25 NOTE — Progress Notes (Signed)
      Patient Care Team: Estanislado PandyPaul W Sasser, MD as PCP - General (Cardiology)   HPI  Barbette HairRobert D [name] is a 48 y.o. male Seen in followup for sinus node dysfunction and syncope  Is s/p pacer  And now without recurrent syncope  There is also systemic process associated with dyspnea on exertion and diaphragmatic weakness which is under ongoing investigation but not thought to be related to his cardiac condition. Systemic markers of inflammation and rheumatological  labs were unrevealing  Past Medical History  Diagnosis Date  . Sinus bradycardia 08/12/2013  . Family history of anesthesia complication     "Mom; forgot what happens" (08/24/2013)  . Dyspnea on exertion     "since ~ 05/2013" (08/24/2013)  . Chronic lower back pain     "q hs when I lay down in bed" (08/24/2013)    Past Surgical History  Procedure Laterality Date  . Back surgery  1992-94    ruptured disc  . Insert / replace / remove pacemaker  08/24/2013  . Tonsillectomy and adenoidectomy  ~ 1977  . Lumbar disc surgery  1992; 1994    "disc ruptured twice" (08/24/2013)    Current Outpatient Prescriptions  Medication Sig Dispense Refill  . gabapentin (NEURONTIN) 300 MG capsule Take 1 capsule by mouth 4 (four) times daily.        No current facility-administered medications for this visit.    Allergies  Allergen Reactions  . Codeine Nausea And Vomiting  . Morphine And Related Nausea And Vomiting  . Penicillins Hives    RASH    Review of Systems negative except from HPI and PMH  Physical Exam BP 120/79  Pulse 75  Ht 5\' 9"  (1.753 m)  Wt 165 lb 12.8 oz (75.206 kg)  BMI 24.47 kg/m2 Well developed and well nourished in no acute distress HENT normal E scleral and icterus clear Neck Supple JVP flat; carotids brisk and full Clear to ausculation Device pocket well healed; without hematoma or erythema.  There is no tethering Regular rate and rhythm, no murmurs gallops or rub Soft with active bowel sounds No  clubbing cyanosis none Edema Alert and oriented, grossly normal motor and sensory function Skin Warm and Dry    Assessment and  Plan

## 2013-10-25 NOTE — Assessment & Plan Note (Signed)
No recurence

## 2013-10-25 NOTE — Patient Instructions (Addendum)
Your physician recommends that you continue on your current medications as directed. Please refer to the Current Medication list given to you today.   Remote monitoring is used to monitor your Pacemaker of ICD from home. This monitoring reduces the number of office visits required to check your device to one time per year. It allows us to keep an eye on the functioning of your device to ensure it is working properly. You are scheduled for a device check from home on 01/26/2014. You may send your transmission at any time that day. If you have a wireless device, the transmission will be sent automatically. After your physician reviews your transmission, you will receive a postcard with your next transmission date.   Your physician recommends that you schedule a follow-up [place] in [place] with Dr. Johney FrameAllred in November 2015  You are able to return to work

## 2013-10-25 NOTE — Assessment & Plan Note (Signed)
77% atrial paced

## 2013-10-25 NOTE — Assessment & Plan Note (Addendum)
The patient's device was interrogated and the information was fully reviewed.  The device was reprogrammed to  Maximize longevity 

## 2013-10-26 ENCOUNTER — Telehealth: Payer: Self-pay | Admitting: Internal Medicine

## 2013-10-26 NOTE — Telephone Encounter (Signed)
Advised pt that he has no restrictions and may return to work.  Called Becky at his work and left message advising pt may return to work - fax us request if need release on paper.

## 2013-10-26 NOTE — Telephone Encounter (Signed)
New message  Patient calling employer is asking for addition information.    Clarification : please specified work  Restriction .    Office # [phone] . Attention Becky F.

## 2013-10-26 NOTE — Telephone Encounter (Signed)
Follow up     Pt want to know when he can go back to work and if there are any restrictions

## 2013-10-27 ENCOUNTER — Telehealth: Payer: Self-pay | Admitting: Cardiovascular Disease

## 2013-10-27 NOTE — Telephone Encounter (Signed)
A return to work note with no restrictions faxed to becky to Fax # [phone].  called Becky at [phone] and left a message to call back if any questions. Pt is aware that [facility]. Pt asked me to call Erskine Squibbjane and let her know also to [phone], left a detail message and to call back if any questions.

## 2013-10-27 NOTE — Telephone Encounter (Signed)
New message  Patient was given a note to return to work, employer needs to know if there are any restrictions? Please call patient and note can be faxed [phone].

## 2013-11-23 ENCOUNTER — Encounter: Payer: Managed Care, Other (non HMO) | Admitting: Internal Medicine

## 2014-01-26 ENCOUNTER — Encounter: Payer: Managed Care, Other (non HMO) | Admitting: *Deleted

## 2014-03-11 ENCOUNTER — Ambulatory Visit (INDEPENDENT_AMBULATORY_CARE_PROVIDER_SITE_OTHER): Payer: Managed Care, Other (non HMO) | Admitting: *Deleted

## 2014-03-11 DIAGNOSIS — I498 Other specified cardiac arrhythmias: Secondary | ICD-10-CM

## 2014-03-11 DIAGNOSIS — Z950 Presence of cardiac pacemaker: Secondary | ICD-10-CM

## 2014-03-11 NOTE — Progress Notes (Signed)
Remote pacemaker transmission.   

## 2014-03-29 ENCOUNTER — Telehealth: Payer: Self-pay | Admitting: Internal Medicine

## 2014-03-29 NOTE — Telephone Encounter (Signed)
New message  Pt called states that he went to the ER as advised and was instructed to see his cardiologist within 2 weeks. No appt available.. Please call pt to assist

## 2014-03-29 NOTE — Telephone Encounter (Signed)
Patient called back to the [address] office scared. He's having tightness in his chest and sharp pains. Please call him at [phone].

## 2014-03-29 NOTE — Telephone Encounter (Signed)
Experiencing SOB with exertion. Also complaining of tightness in Chest  Stated that his work sent him home and wanted him to be seen ASAP by doctor

## 2014-03-29 NOTE — Telephone Encounter (Signed)
Pt explains that chest tightness/SOB/heart racing on and off last 2 weeks.  More frequently this occurs with exertion. This morning it came and went, but stayed longer that previous times, and he was sent home from work. Pt advised to go to ED to be evaluated. Pt is agreeable to this.

## 2014-03-29 NOTE — Telephone Encounter (Addendum)
[facility] -- according to him they found nothing.  Will see about getting records.  Pt scheduled to see Graciela HusbandsKlein 6/30 at 1:30. Advised to call if issues arise before then. He is agreeable to this.

## 2014-04-04 LAB — MDC_IDC_ENUM_SESS_TYPE_REMOTE
Brady Statistic RA Percent Paced: 78 %
Implantable Pulse Generator Model: 359529
Implantable Pulse Generator Serial Number: 68069787
Lead Channel Impedance Value: 488 Ohm
Lead Channel Pacing Threshold Amplitude: 0.8 V
Lead Channel Pacing Threshold Pulse Width: 0.4 ms
MDC IDC MSMT LEADCHNL RA SENSING INTR AMPL: 3 mV
MDC IDC MSMT LEADCHNL RV IMPEDANCE VALUE: 624 Ohm
MDC IDC MSMT LEADCHNL RV SENSING INTR AMPL: 13.3 mV
MDC IDC STAT BRADY RV PERCENT PACED: 0 %

## 2014-04-06 ENCOUNTER — Encounter: Payer: Self-pay | Admitting: Internal Medicine

## 2014-04-06 ENCOUNTER — Encounter: Payer: Self-pay | Admitting: Anesthesiology

## 2014-04-06 ENCOUNTER — Encounter: Payer: Self-pay | Admitting: *Deleted

## 2014-04-06 ENCOUNTER — Ambulatory Visit (INDEPENDENT_AMBULATORY_CARE_PROVIDER_SITE_OTHER): Payer: Managed Care, Other (non HMO) | Admitting: Internal Medicine

## 2014-04-06 VITALS — BP 108/66 | HR 73 | Ht 69.0 in | Wt 155.0 lb

## 2014-04-06 DIAGNOSIS — R079 Chest pain, unspecified: Secondary | ICD-10-CM

## 2014-04-06 DIAGNOSIS — Z01810 Encounter for preprocedural cardiovascular examination: Secondary | ICD-10-CM

## 2014-04-06 NOTE — Patient Instructions (Signed)
Your physician has requested that you have a cardiac catheterization. Cardiac catheterization is used to diagnose and/or treat various heart conditions. Doctors may recommend this procedure for a number of different reasons. The most common reason is to evaluate chest pain. Chest pain can be a symptom of coronary artery disease (CAD), and cardiac catheterization can show whether plaque is narrowing or blocking your heart's arteries. This procedure is also used to evaluate the valves, as well as measure the blood flow and oxygen levels in different parts of your heart. For further information please visit [email or web address]. Please follow instruction sheet, as given. Your physician recommends that you continue on your current medications as directed. Please refer to the Current Medication list given to you today. Your physician recommends that you return for lab work in:  TODAY   BMET  CBC INR   BLOOD  CULTURE  X2

## 2014-04-06 NOTE — Progress Notes (Signed)
      Patient Care Team: Estanislado PandyPaul W Sasser, MD as PCP - General (Cardiology)   HPI  John [name] is a 48 y.o. male Seen in followup for sinus node dysfunction and syncope  Is s/p pacer  And now without recurrent syncope  There is also systemic process associated with dyspnea on exertion and diaphragmatic weakness which is under ongoing investigation but not thought to be related to his cardiac condition. Systemic markers of inflammation and rheumatological  labs were unrevealing  He comes in now with complaints of 3 weeks of reproducibly inducible chest discomfort, heaviness without radiation associated with shortness of breath, to a greater degree than normal  Occasionally assoc with nausea and diaphoresis  Some generalized malaise and  Fatigue; no fevers or chills  Also episodes of tachypalps Past Medical History  Diagnosis Date  . Sinus bradycardia 08/12/2013  . Family history of anesthesia complication     "Mom; forgot what happens" (08/24/2013)  . Dyspnea on exertion     "since ~ 05/2013" (08/24/2013)  . Chronic lower back pain     "q hs when I lay down in bed" (08/24/2013)    Past Surgical History  Procedure Laterality Date  . Back surgery  1992-94    ruptured disc  . Insert / replace / remove pacemaker  08/24/2013  . Tonsillectomy and adenoidectomy  ~ 1977  . Lumbar disc surgery  1992; 1994    "disc ruptured twice" (08/24/2013)    Current Outpatient Prescriptions  Medication Sig Dispense Refill  . nitroGLYCERIN (NITROSTAT) 0.4 MG SL tablet Place 0.4 mg under the tongue every 5 (five) minutes as needed for chest pain.       No current facility-administered medications for this visit.    Allergies  Allergen Reactions  . Codeine Nausea And Vomiting  . Morphine And Related Nausea And Vomiting  . Penicillins Hives    RASH    Review of Systems negative except from HPI and PMH  Physical Exam BP 108/66  Pulse 73  Ht 5\' 9"  (1.753 m)  Wt 155 lb (70.308 kg)   BMI 22.88 kg/m2 Well developed and well nourished in no acute distress HENT normal E scleral and icterus clear Neck Supple JVP flat; carotids brisk and full Clear to ausculation Device pocket well healed; without hematoma or erythema.  There is no tethering Regular rate and rhythm, no murmurs gallops or rub Soft with active bowel sounds No clubbing cyanosis tc Edema Alert and oriented, grossly normal motor and sensory function Skin Warm and Dry  ECG  Apacing   14/08/36  Assessment and  Plan  Chest pain   DOE  Sinus node dysfunction  Pacemaker  Biotronik  Atrial Tachycardia  concderning chest pain story despite neg stress echo in the fall  Have discussed CT vs Cath,  With pain syndrome, will proceed with cath  If normal and normal LV fn will need echo and then referral back to Pulm  With generalized mailise and fatigue, need to also exclude device infection  Atrial tach is occuring but for now will withhold therapy but would consider betablocker

## 2014-04-07 ENCOUNTER — Other Ambulatory Visit (INDEPENDENT_AMBULATORY_CARE_PROVIDER_SITE_OTHER): Payer: Managed Care, Other (non HMO)

## 2014-04-07 ENCOUNTER — Encounter (HOSPITAL_COMMUNITY): Payer: Self-pay | Admitting: Pharmacy Technician

## 2014-04-07 DIAGNOSIS — R079 Chest pain, unspecified: Secondary | ICD-10-CM

## 2014-04-07 DIAGNOSIS — Z01810 Encounter for preprocedural cardiovascular examination: Secondary | ICD-10-CM

## 2014-04-07 LAB — CBC WITH DIFFERENTIAL/PLATELET
BASOS ABS: 0 10*3/uL (ref 0.0–0.1)
Basophils Relative: 0.6 % (ref 0.0–3.0)
Eosinophils Absolute: 0.2 10*3/uL (ref 0.0–0.7)
Eosinophils Relative: 4 % (ref 0.0–5.0)
HEMATOCRIT: 43.4 % (ref 39.0–52.0)
Hemoglobin: 14.9 g/dL (ref 13.0–17.0)
LYMPHS ABS: 1.6 10*3/uL (ref 0.7–4.0)
Lymphocytes Relative: 31.3 % (ref 12.0–46.0)
MCHC: 34.3 g/dL (ref 30.0–36.0)
MCV: 82.7 fl (ref 78.0–100.0)
MONO ABS: 0.3 10*3/uL (ref 0.1–1.0)
MONOS PCT: 6.6 % (ref 3.0–12.0)
Neutro Abs: 2.9 10*3/uL (ref 1.4–7.7)
Neutrophils Relative %: 57.5 % (ref 43.0–77.0)
Platelets: 265 10*3/uL (ref 150.0–400.0)
RBC: 5.25 Mil/uL (ref 4.22–5.81)
RDW: 13.3 % (ref 11.5–15.5)
WBC: 5.1 10*3/uL (ref 4.0–10.5)

## 2014-04-07 LAB — BASIC METABOLIC PANEL
BUN: 14 mg/dL (ref 6–23)
CALCIUM: 9.3 mg/dL (ref 8.4–10.5)
CO2: 32 meq/L (ref 19–32)
Chloride: 105 mEq/L (ref 96–112)
Creatinine, Ser: 1.1 mg/dL (ref 0.4–1.5)
GFR: 78.45 mL/min (ref >60.00)
Glucose, Bld: 76 mg/dL (ref 70–99)
Potassium: 4.4 mEq/L (ref 3.5–5.1)
SODIUM: 140 meq/L (ref 135–145)

## 2014-04-07 LAB — PROTIME-INR
INR: 1.1 ratio — AB (ref 0.8–1.0)
PROTHROMBIN TIME: 11.8 s (ref 9.6–13.1)

## 2014-04-08 ENCOUNTER — Ambulatory Visit (HOSPITAL_COMMUNITY)
Admission: RE | Admit: 2014-04-08 | Discharge: 2014-04-08 | Disposition: A | Discharge status: Home / Self Care | Payer: Managed Care, Other (non HMO) | Source: Ambulatory Visit | Attending: Interventional Cardiology | Admitting: Interventional Cardiology

## 2014-04-08 ENCOUNTER — Telehealth: Payer: Self-pay | Admitting: *Deleted

## 2014-04-08 ENCOUNTER — Encounter (HOSPITAL_COMMUNITY)
Admission: RE | Disposition: A | Discharge status: Home / Self Care | Payer: Self-pay | Source: Ambulatory Visit | Attending: Interventional Cardiology

## 2014-04-08 DIAGNOSIS — R0989 Other specified symptoms and signs involving the circulatory and respiratory systems: Secondary | ICD-10-CM

## 2014-04-08 DIAGNOSIS — M545 Low back pain, unspecified: Secondary | ICD-10-CM | POA: Insufficient documentation

## 2014-04-08 DIAGNOSIS — Z950 Presence of cardiac pacemaker: Secondary | ICD-10-CM | POA: Insufficient documentation

## 2014-04-08 DIAGNOSIS — G8929 Other chronic pain: Secondary | ICD-10-CM | POA: Insufficient documentation

## 2014-04-08 DIAGNOSIS — I495 Sick sinus syndrome: Secondary | ICD-10-CM | POA: Insufficient documentation

## 2014-04-08 DIAGNOSIS — R0789 Other chest pain: Secondary | ICD-10-CM | POA: Insufficient documentation

## 2014-04-08 DIAGNOSIS — R079 Chest pain, unspecified: Secondary | ICD-10-CM

## 2014-04-08 DIAGNOSIS — R0609 Other forms of dyspnea: Secondary | ICD-10-CM | POA: Insufficient documentation

## 2014-04-08 DIAGNOSIS — Z01810 Encounter for preprocedural cardiovascular examination: Secondary | ICD-10-CM

## 2014-04-08 HISTORY — PX: LEFT HEART CATHETERIZATION WITH CORONARY ANGIOGRAM: SHX5451

## 2014-04-08 LAB — PROTIME-INR
INR: 1.07 (ref 0.00–1.49)
PROTHROMBIN TIME: 13.9 s (ref 11.6–15.2)

## 2014-04-08 LAB — CBC
HCT: 39.9 % (ref 39.0–52.0)
Hemoglobin: 13.6 g/dL (ref 13.0–17.0)
MCH: 28 pg (ref 26.0–34.0)
MCHC: 34.1 g/dL (ref 30.0–36.0)
MCV: 82.3 fL (ref 78.0–100.0)
PLATELETS: 190 10*3/uL (ref 150–400)
RBC: 4.85 MIL/uL (ref 4.22–5.81)
RDW: 12.8 % (ref 11.5–15.5)
WBC: 4.8 10*3/uL (ref 4.0–10.5)

## 2014-04-08 LAB — BASIC METABOLIC PANEL
BUN: 16 mg/dL (ref 6–23)
CO2: 25 mEq/L (ref 19–32)
Calcium: 9 mg/dL (ref 8.4–10.5)
Chloride: 105 mEq/L (ref 96–112)
Creatinine, Ser: 0.94 mg/dL (ref 0.50–1.35)
GFR calc Af Amer: >90 mL/min (ref >90)
GFR calc non Af Amer: >90 mL/min (ref >90)
Glucose, Bld: 97 mg/dL (ref 70–99)
Potassium: 4.3 mEq/L (ref 3.7–5.3)
Sodium: 143 mEq/L (ref 137–147)

## 2014-04-08 SURGERY — LEFT HEART CATHETERIZATION WITH CORONARY ANGIOGRAM
Anesthesia: LOCAL

## 2014-04-08 MED ORDER — SODIUM CHLORIDE 0.9 % IV SOLN: 250.0000 mL | INTRAVENOUS | Status: DC | PRN

## 2014-04-08 MED ORDER — LIDOCAINE HCL (PF) 1 % IJ SOLN
INTRAMUSCULAR | Status: AC
  Filled 2014-04-08: qty 30

## 2014-04-08 MED ORDER — SODIUM CHLORIDE 0.9 % IJ SOLN: 3.0000 mL | Freq: Two times a day (BID) | INTRAMUSCULAR | Status: DC

## 2014-04-08 MED ORDER — SODIUM CHLORIDE 0.9 % IV SOLN: INTRAVENOUS | Status: DC

## 2014-04-08 MED ORDER — HEPARIN SODIUM (PORCINE) 1000 UNIT/ML IJ SOLN
INTRAMUSCULAR | Status: AC
  Filled 2014-04-08: qty 1

## 2014-04-08 MED ORDER — VERAPAMIL HCL 2.5 MG/ML IV SOLN
INTRAVENOUS | Status: AC
  Filled 2014-04-08: qty 2

## 2014-04-08 MED ORDER — MIDAZOLAM HCL 2 MG/2ML IJ SOLN
INTRAMUSCULAR | Status: AC
  Filled 2014-04-08: qty 2

## 2014-04-08 MED ORDER — ASPIRIN 81 MG PO CHEW
81.0000 mg | CHEWABLE_TABLET | ORAL | Status: DC
  Filled 2014-04-08: qty 1

## 2014-04-08 MED ORDER — NITROGLYCERIN 0.2 MG/ML ON CALL CATH LAB
INTRAVENOUS | Status: AC
  Filled 2014-04-08: qty 1

## 2014-04-08 MED ORDER — SODIUM CHLORIDE 0.9 % IJ SOLN: 3.0000 mL | INTRAMUSCULAR | Status: DC | PRN

## 2014-04-08 MED ORDER — FENTANYL CITRATE 0.05 MG/ML IJ SOLN
INTRAMUSCULAR | Status: AC
  Filled 2014-04-08: qty 2

## 2014-04-08 MED ORDER — HEPARIN (PORCINE) IN NACL 2-0.9 UNIT/ML-% IJ SOLN
INTRAMUSCULAR | Status: AC
  Filled 2014-04-08: qty 1000

## 2014-04-08 NOTE — H&P (View-Only) (Signed)
      Patient Care Team: Estanislado PandyPaul W Sasser, MD as PCP - General (Cardiology)   HPI  John [name] is a 48 y.o. male Seen in followup for sinus node dysfunction and syncope  Is s/p pacer  And now without recurrent syncope  There is also systemic process associated with dyspnea on exertion and diaphragmatic weakness which is under ongoing investigation but not thought to be related to his cardiac condition. Systemic markers of inflammation and rheumatological  labs were unrevealing  He comes in now with complaints of 3 weeks of reproducibly inducible chest discomfort, heaviness without radiation associated with shortness of breath, to a greater degree than normal  Occasionally assoc with nausea and diaphoresis  Some generalized malaise and  Fatigue; no fevers or chills  Also episodes of tachypalps Past Medical History  Diagnosis Date  . Sinus bradycardia 08/12/2013  . Family history of anesthesia complication     "Mom; forgot what happens" (08/24/2013)  . Dyspnea on exertion     "since ~ 05/2013" (08/24/2013)  . Chronic lower back pain     "q hs when I lay down in bed" (08/24/2013)    Past Surgical History  Procedure Laterality Date  . Back surgery  1992-94    ruptured disc  . Insert / replace / remove pacemaker  08/24/2013  . Tonsillectomy and adenoidectomy  ~ 1977  . Lumbar disc surgery  1992; 1994    "disc ruptured twice" (08/24/2013)    Current Outpatient Prescriptions  Medication Sig Dispense Refill  . nitroGLYCERIN (NITROSTAT) 0.4 MG SL tablet Place 0.4 mg under the tongue every 5 (five) minutes as needed for chest pain.       No current facility-administered medications for this visit.    Allergies  Allergen Reactions  . Codeine Nausea And Vomiting  . Morphine And Related Nausea And Vomiting  . Penicillins Hives    RASH    Review of Systems negative except from HPI and PMH  Physical Exam BP 108/66  Pulse 73  Ht 5\' 9"  (1.753 m)  Wt 155 lb (70.308 kg)   BMI 22.88 kg/m2 Well developed and well nourished in no acute distress HENT normal E scleral and icterus clear Neck Supple JVP flat; carotids brisk and full Clear to ausculation Device pocket well healed; without hematoma or erythema.  There is no tethering Regular rate and rhythm, no murmurs gallops or rub Soft with active bowel sounds No clubbing cyanosis tc Edema Alert and oriented, grossly normal motor and sensory function Skin Warm and Dry  ECG  Apacing   14/08/36  Assessment and  Plan  Chest pain   DOE  Sinus node dysfunction  Pacemaker  Biotronik  Atrial Tachycardia  concderning chest pain story despite neg stress echo in the fall  Have discussed CT vs Cath,  With pain syndrome, will proceed with cath  If normal and normal LV fn will need echo and then referral back to Pulm  With generalized mailise and fatigue, need to also exclude device infection  Atrial tach is occuring but for now will withhold therapy but would consider betablocker

## 2014-04-08 NOTE — Telephone Encounter (Signed)
lmom labs normal 

## 2014-04-08 NOTE — Interval H&P Note (Signed)
Cath Lab Visit (complete for each Cath Lab visit)  Clinical Evaluation Leading to the Procedure:   ACS: No.  Non-ACS:    Anginal Classification: CCS II  Anti-ischemic medical therapy: Minimal Therapy (1 class of medications)  Non-Invasive Test Results: Low-risk stress test findings: cardiac mortality <1%/year  Prior CABG: No previous CABG      History and Physical Interval Note:  04/08/2014 9:40 AM  John [name]  has presented today for surgery, with the diagnosis of cp  The various methods of treatment have been discussed with the patient and family. After consideration of risks, benefits and other options for treatment, the patient has consented to  Procedure(s): LEFT HEART CATHETERIZATION WITH CORONARY ANGIOGRAM (N/A) as a surgical intervention .  The patient's history has been reviewed, patient examined, no change in status, stable for surgery.  I have reviewed the patient's chart and labs.  Questions were answered to the patient's satisfaction.     John [name],John [name]

## 2014-04-08 NOTE — Interval H&P Note (Signed)
Cath Lab Visit (complete for each Cath Lab visit)  Clinical Evaluation Leading to the Procedure:   ACS: No.  Non-ACS:    Anginal Classification: CCS III  Anti-ischemic medical therapy: No Therapy  Non-Invasive Test Results: No non-invasive testing performed  Prior CABG: No previous CABG      History and Physical Interval Note:  04/08/2014 1:44 PM  John [name]  has presented today for surgery, with the diagnosis of cp  The various methods of treatment have been discussed with the patient and family. After consideration of risks, benefits and other options for treatment, the patient has consented to  Procedure(s): LEFT HEART CATHETERIZATION WITH CORONARY ANGIOGRAM (N/A) as a surgical intervention .  The patient's history has been reviewed, patient examined, no change in status, stable for surgery.  I have reviewed the patient's chart and labs.  Questions were answered to the patient's satisfaction.     Lesleigh NoeSMITH III,HENRY W

## 2014-04-08 NOTE — Discharge Instructions (Signed)
Radial Site Care °Refer to this sheet in the next few weeks. These instructions provide you with information on caring for yourself after your procedure. Your caregiver may also give you more specific instructions. Your treatment has been planned according to current medical practices, but problems sometimes occur. Call your caregiver if you have any problems or questions after your procedure. °HOME CARE INSTRUCTIONS °· You may shower the day after the procedure. Remove the bandage (dressing) and gently wash the site with plain soap and water. Gently pat the site dry. °· Do not apply powder or lotion to the site. °· Do not submerge the affected site in water for 3 to 5 days. °· Inspect the site at least twice daily. °· Do not flex or bend the affected arm for 24 hours. °· No lifting over 5 pounds (2.3 kg) for 5 days after your procedure. °· Do not drive home if you are discharged the same day of the procedure. Have someone else drive you. °· You may drive 24 hours after the procedure unless otherwise instructed by your caregiver. °· Do not operate machinery or power tools for 24 hours. °· A responsible adult should be with you for the first 24 hours after you arrive home. °What to expect: °· Any bruising will usually fade within 1 to 2 weeks. °· Blood that collects in the tissue (hematoma) may be painful to the touch. It should usually decrease in size and tenderness within 1 to 2 weeks. °SEEK IMMEDIATE MEDICAL CARE IF: °· You have unusual pain at the radial site. °· You have redness, warmth, swelling, or pain at the radial site. °· You have drainage (other than a small amount of blood on the dressing). °· You have chills. °· You have a fever or persistent symptoms for more than 72 hours. °· You have a fever and your symptoms suddenly get worse. °· Your arm becomes pale, cool, tingly, or numb. °· You have heavy bleeding from the site. Hold pressure on the site. °Document Released: 11/02/2010 Document Revised:  12/23/2011 Document Reviewed: 11/02/2010 °Exit[employer]® Patient Information ©2015 [employer]. This information is not intended to replace advice given to you by your health care provider. Make sure you discuss any questions you have with your health care provider. ° °

## 2014-04-08 NOTE — CV Procedure (Signed)
     Left  Heart Catheterization with Coronary Angiography  Report  John [name]  48 y.o.  male 07/24/1966  Procedure Date: 04/08/2014 Referring Physician: Sherryl MangesSteven Klein, M.D. Primary Cardiologist:: Sherryl MangesSteven Klein, M.D.  INDICATIONS: Dyspnea and chest pressure, continuous. Symptoms had been present for 3 weeks.  PROCEDURE: 1. Left heart catheterization; 2. Coronary angiography; 3. Left ventriculography  CONSENT:  The risks, benefits, and details of the procedure were explained in detail to the patient. Risks including death, stroke, heart attack, kidney injury, allergy, limb ischemia, bleeding and radiation injury were discussed.  The patient verbalized understanding and wanted to proceed.  Informed written consent was obtained.  PROCEDURE TECHNIQUE:  After Xylocaine anesthesia a 5 French Slender sheath was placed in the right radial artery with an angiocath and the modified Seldinger technique.  Coronary angiography was done using a 5 F JR 4 and JL 3.5 cm diagnostic catheter.  Left ventriculography was done using the JR 4 catheter and hand injection.   Review of the digital images demonstrated a mid segment of the LAD with a segmental narrowing and also evidence of systolic compression. Intracoronary nitroglycerin, 200 mcg lead to dramatic improvement in the caliber of the vessel segment. Focal systolic compression was still noted.   CONTRAST:  Total of 80 cc.  COMPLICATIONS:  None   HEMODYNAMICS:  Aortic pressure 87/51 mmHg; LV pressure 87/4 mmHg; LVEDP 8 mm mercury  ANGIOGRAPHIC DATA:   The left main coronary artery is normal.  The left anterior descending artery is large and transapical. The mid segment after the first diagonal appears to be intramyocardial. The initial images revealed a segmental 50% narrowing. Intracoronary nitroglycerin relieved the segmental narrowing but there was still focal systolic compression just distal to the second large septal perforator branch. The  diagonal and the entire LAD is free of any significant/fixed obstruction.   The left circumflex artery is widely patent. The first obtuse marginal trifurcates on the lateral wall. A small second third and fourth obtuse marginal also widely patent..  The right coronary artery is dominant and normal..  LEFT VENTRICULOGRAM:  Left ventricular angiogram was done in the 30 RAO projection and revealed normal with an EF of 60%.   IMPRESSIONS:  1. Normal coronary arteries with no evidence of obstructive coronary disease.  2. 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  3. Normal left ventricular systolic function and hemodynamics.  4. Continuous chest discomfort for 3 weeks, possibly related to reflux   RECOMMENDATION:  Per Dr. Graciela HusbandsKlein. Consider GI source versus other. The patient appears depressed.

## 2014-04-11 ENCOUNTER — Telehealth: Payer: Self-pay | Admitting: Internal Medicine

## 2014-04-11 NOTE — Telephone Encounter (Signed)
Personal voicemail box full and wouldn't let me leave a message

## 2014-04-11 NOTE — Telephone Encounter (Signed)
New Message:  Pt states he is calling in reference to FMLA paperwork that was faxed to our office on 6/25. States he needs it completed as soon as possible. Pt is requesting a call back from the nurse.

## 2014-04-11 NOTE — Telephone Encounter (Signed)
Follow up     Pt called to follow up on his FMLA paper work.   Please give him a call back.

## 2014-04-12 ENCOUNTER — Encounter: Payer: Self-pay | Admitting: Cardiology

## 2014-04-12 ENCOUNTER — Encounter: Payer: Managed Care, Other (non HMO) | Admitting: Internal Medicine

## 2014-04-13 ENCOUNTER — Telehealth: Payer: Self-pay | Admitting: Internal Medicine

## 2014-04-13 LAB — CULTURE, BLOOD (SINGLE): Organism ID, Bacteria: NO GROWTH

## 2014-04-13 NOTE — Telephone Encounter (Signed)
Pt Aware we Do no have FMLA  Papers, He Will get Damaris HippoSedwick To Refax

## 2014-04-13 NOTE — Telephone Encounter (Signed)
See 7/1 telephone note

## 2014-04-21 ENCOUNTER — Encounter: Payer: Self-pay | Admitting: Internal Medicine

## 2014-06-15 ENCOUNTER — Encounter: Payer: Managed Care, Other (non HMO) | Admitting: *Deleted

## 2014-06-15 ENCOUNTER — Telehealth: Payer: Self-pay | Admitting: Cardiology

## 2014-06-15 NOTE — Telephone Encounter (Signed)
Spoke with pt and reminded pt of remote transmission that is due today. Pt verbalized understanding.   

## 2014-06-16 ENCOUNTER — Encounter: Payer: Self-pay | Admitting: Cardiology

## 2014-07-19 ENCOUNTER — Encounter: Payer: Self-pay | Admitting: *Deleted

## 2014-09-05 ENCOUNTER — Ambulatory Visit (INDEPENDENT_AMBULATORY_CARE_PROVIDER_SITE_OTHER): Payer: Managed Care, Other (non HMO) | Admitting: *Deleted

## 2014-09-05 DIAGNOSIS — R001 Bradycardia, unspecified: Secondary | ICD-10-CM

## 2014-09-05 NOTE — Progress Notes (Signed)
Remote pacemaker transmission.   

## 2014-09-07 LAB — MDC_IDC_ENUM_SESS_TYPE_REMOTE
Brady Statistic RA Percent Paced: 81 %
Implantable Pulse Generator Serial Number: 68069787
Lead Channel Pacing Threshold Amplitude: 0.7 V
Lead Channel Pacing Threshold Amplitude: 0.9 V
Lead Channel Pacing Threshold Pulse Width: 0.4 ms
Lead Channel Pacing Threshold Pulse Width: 0.4 ms
Lead Channel Sensing Intrinsic Amplitude: 12.5 mV
Lead Channel Setting Pacing Pulse Width: 0.4 ms
MDC IDC MSMT LEADCHNL RA IMPEDANCE VALUE: 527 Ohm
MDC IDC MSMT LEADCHNL RA SENSING INTR AMPL: 2.7 mV
MDC IDC MSMT LEADCHNL RV IMPEDANCE VALUE: 605 Ohm
MDC IDC PG MODEL: 359529
MDC IDC SET LEADCHNL RA PACING AMPLITUDE: 1.7 V
MDC IDC SET LEADCHNL RV PACING AMPLITUDE: 1.9 V
MDC IDC STAT BRADY RV PERCENT PACED: 0 %

## 2014-09-20 ENCOUNTER — Encounter: Payer: Self-pay | Admitting: Cardiology

## 2014-09-22 ENCOUNTER — Encounter (HOSPITAL_COMMUNITY): Payer: Self-pay | Admitting: Internal Medicine

## 2014-09-26 ENCOUNTER — Encounter: Payer: Self-pay | Admitting: Internal Medicine

## 2014-10-21 ENCOUNTER — Encounter: Payer: Self-pay | Admitting: Internal Medicine

## 2014-10-21 ENCOUNTER — Ambulatory Visit (INDEPENDENT_AMBULATORY_CARE_PROVIDER_SITE_OTHER): Payer: Managed Care, Other (non HMO) | Admitting: Internal Medicine

## 2014-10-21 VITALS — BP 99/62 | HR 68 | Ht 69.0 in | Wt 152.8 lb

## 2014-10-21 DIAGNOSIS — I495 Sick sinus syndrome: Secondary | ICD-10-CM

## 2014-10-21 DIAGNOSIS — R55 Syncope and collapse: Secondary | ICD-10-CM | POA: Diagnosis not present

## 2014-10-21 DIAGNOSIS — Z950 Presence of cardiac pacemaker: Secondary | ICD-10-CM

## 2014-10-21 DIAGNOSIS — R001 Bradycardia, unspecified: Secondary | ICD-10-CM

## 2014-10-21 LAB — MDC_IDC_ENUM_SESS_TYPE_INCLINIC
Brady Statistic RV Percent Paced: 0 %
Implantable Pulse Generator Model: 359529
Lead Channel Impedance Value: 526 Ohm
Lead Channel Pacing Threshold Amplitude: 0.6 V
Lead Channel Sensing Intrinsic Amplitude: 10.6 mV
Lead Channel Setting Pacing Amplitude: 1.7 V
Lead Channel Setting Pacing Amplitude: 1.9 V
MDC IDC MSMT LEADCHNL RA PACING THRESHOLD PULSEWIDTH: 0.4 ms
MDC IDC MSMT LEADCHNL RA SENSING INTR AMPL: 3.1 mV
MDC IDC MSMT LEADCHNL RV IMPEDANCE VALUE: 526 Ohm
MDC IDC MSMT LEADCHNL RV PACING THRESHOLD AMPLITUDE: 0.8 V
MDC IDC MSMT LEADCHNL RV PACING THRESHOLD PULSEWIDTH: 0.4 ms
MDC IDC PG SERIAL: 68069787
MDC IDC SET LEADCHNL RV PACING PULSEWIDTH: 0.4 ms
MDC IDC STAT BRADY RA PERCENT PACED: 78 %

## 2014-10-21 NOTE — Progress Notes (Signed)
PCP: Estanislado PandySASSER,PAUL W, MD  John [name] is a 49 y.o. male who presents today for routine electrophysiology followup.  Since last being seen in EP clinic, the patient reports doing very well.  At that time he was having SOB and CP.  He underwent cath by Dr Katrinka BlazingSmith which revealed no CAD.  He has done well since.  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  He has decided to not let work stress him out and has received significant improvement since. Today, he denies symptoms of palpitations, lower extremity edema, dizziness, presyncope, or syncope.  The patient is otherwise without complaint today.   Past Medical History  Diagnosis Date  . Sinus bradycardia 08/12/2013  . Family history of anesthesia complication     "Mom; forgot what happens" (08/24/2013)  . Dyspnea on exertion     "since ~ 05/2013" (08/24/2013)  . Chronic lower back pain     "q hs when I lay down in bed" (08/24/2013)   Past Surgical History  Procedure Laterality Date  . Back surgery  1992-94    ruptured disc  . Pacemaker insertion  08/24/2013    Biotronik dual chamber PPM implanted by Dr Johney FrameAllred  . Tonsillectomy and adenoidectomy  ~ 1977  . Lumbar disc surgery  1992; 1994    "disc ruptured twice" (08/24/2013)  . Permanent pacemaker insertion N/A 08/24/2013    Procedure: PERMANENT PACEMAKER INSERTION;  Surgeon: Gardiner RhymeJames D Saraih Lorton, MD;  Location: [facility] C[facility];  Service: Cardiovascular;  Laterality: N/A;  . Left heart catheterization with coronary angiogram N/A 04/08/2014    Procedure: LEFT HEART CATHETERIZATION WITH CORONARY ANGIOGRAM;  Surgeon: Lesleigh NoeHenry W Smith III, MD;  [facility];  Service: Cardiovascular;  Laterality: N/A;    ROS- all systems are reviewed and negative except as per HPI above  Current Outpatient Prescriptions  Medication Sig Dispense Refill  . nitroGLYCERIN (NITROSTAT) 0.4 MG SL tablet Place 0.4 mg under the tongue every 5 (five) minutes as  needed for chest pain.     No current facility-administered medications for this visit.    Physical Exam: Filed Vitals:   10/21/14 0907  BP: 99/62  Pulse: 68  Height: 5\' 9"  (1.753 m)  Weight: 152 lb 12.8 oz (69.31 kg)  SpO2: 97%    GEN- The patient is well appearing, alert and oriented x 3 today.   Head- normocephalic, atraumatic Eyes-  Sclera clear, conjunctiva pink Ears- hearing intact Oropharynx- clear Lungs- Clear to ausculation bilaterally, normal work of breathing Chest- pacemaker pocket is well healed Heart- Regular rate and rhythm, no murmurs, rubs or gallops, PMI not laterally displaced GI- soft, NT, ND, + BS Extremities- no clubbing, cyanosis, or edema  Pacemaker interrogation- reviewed in detail today,  See PACEART report  Assessment and Plan:  1. Sick sinus syndrome Normal pacemaker function See Pace Art report No changes today  2. SOB/ CP Resolved No further workup planned  Remote monitoring Return in 1 year

## 2014-10-21 NOTE — Patient Instructions (Signed)
Your physician recommends that you schedule a follow-up appointment in: 1 year with Dr. Johney FrameAllred. You will receive a reminder letter in the mail in about 10 months reminding you to call and schedule your appointment. If you don't receive this letter, please contact our office.  Device check from home 04.07/16. Your physician recommends that you continue on your current medications as directed. Please refer to the Current Medication list given to you today.

## 2014-11-07 ENCOUNTER — Telehealth: Payer: Self-pay | Admitting: *Deleted

## 2014-11-07 NOTE — Telephone Encounter (Signed)
Per wife, patient was picked up at work today and [facility] wants [facility] for observation but patient is requesting to be seen in the office today by a provider. Nurse advised patient that he should be evaluated in the ED and [facility] Patient's wife advised that he is currently being followed by our EP team and neither of those physicians are at the office today.

## 2014-11-10 ENCOUNTER — Encounter: Payer: Self-pay | Admitting: Cardiovascular Disease

## 2014-11-10 ENCOUNTER — Encounter: Payer: Self-pay | Admitting: *Deleted

## 2014-11-10 ENCOUNTER — Ambulatory Visit (INDEPENDENT_AMBULATORY_CARE_PROVIDER_SITE_OTHER): Payer: BLUE CROSS/BLUE SHIELD | Admitting: Cardiovascular Disease

## 2014-11-10 VITALS — BP 122/72 | HR 75 | Ht 69.0 in | Wt 157.0 lb

## 2014-11-10 DIAGNOSIS — Z950 Presence of cardiac pacemaker: Secondary | ICD-10-CM

## 2014-11-10 DIAGNOSIS — Z9289 Personal history of other medical treatment: Secondary | ICD-10-CM

## 2014-11-10 DIAGNOSIS — Z87898 Personal history of other specified conditions: Secondary | ICD-10-CM

## 2014-11-10 DIAGNOSIS — R079 Chest pain, unspecified: Secondary | ICD-10-CM

## 2014-11-10 DIAGNOSIS — R002 Palpitations: Secondary | ICD-10-CM

## 2014-11-10 DIAGNOSIS — I4589 Other specified conduction disorders: Secondary | ICD-10-CM

## 2014-11-10 NOTE — Progress Notes (Addendum)
Patient ID: John [name], male   DOB: 1966/04/20, 49 y.o.   MRN: [id number]          CARDIOLOGY CONSULT NOTE  Patient ID: John [name] MRN: [id number] DOB/AGE: 15-Mar-1966 49 y.o.  Admit date: (Not on file) Primary Physician Estanislado Pandy, MD  Reason for Consultation: chest pain, recent ED evaluation  HPI:  The patient is a 49 year old male with a history of sick sinus syndrome for which he has a pacemaker and is followed by Dr. Johney Frame. Most recent device interrogation occurred on January 8 which demonstrated normal device function with 9 mode switches with no high ventricular rates noted. He had been having significant chest pain in the summer of 2015 and underwent coronary angiography in June 2015 by Dr. Garnette Scheuermann which demonstrated no fixed obstruction in any of the epicardial coronary arteries. He did have mid LAD intramyocardial ridging with dynamic systolic compression. He was reportedly recently evaluated in the ED for chest pain at [facility], but I do not have these records available at this time. He said he was at work and works on a Journalist, newspaper heavy objects. He began to experience sharp chest pain and a sensation of his heart racing accompanied by nausea and vomiting. He was taken to the ED and was told he likely had a pulled muscle and was instructed to go home and take ibuprofen. He did so and said that this alleviated some of his pain. He said he was awoken by shortness of breath on one occasion last week. He occasionally has chest discomfort when shoveling snow. He otherwise feels well and would like to return to work. He underwent a submaximal cardiopulmonary exercise test in 2014.     Allergies  Allergen Reactions  . Codeine Nausea And Vomiting  . Morphine And Related Nausea And Vomiting  . Penicillins Hives    RASH    Current Outpatient Prescriptions  Medication Sig Dispense Refill  . nitroGLYCERIN (NITROSTAT) 0.4 MG SL tablet  Place 0.4 mg under the tongue every 5 (five) minutes as needed for chest pain.     No current facility-administered medications for this visit.    Past Medical History  Diagnosis Date  . Sinus bradycardia 08/12/2013  . Family history of anesthesia complication     "Mom; forgot what happens" (08/24/2013)  . Dyspnea on exertion     "since ~ 05/2013" (08/24/2013)  . Chronic lower back pain     "q hs when I lay down in bed" (08/24/2013)    Past Surgical History  Procedure Laterality Date  . Back surgery  1992-94    ruptured disc  . Pacemaker insertion  08/24/2013    Biotronik dual chamber PPM implanted by Dr Johney Frame  . Tonsillectomy and adenoidectomy  ~ 1977  . Lumbar disc surgery  1992; 1994    "disc ruptured twice" (08/24/2013)  . Permanent pacemaker insertion N/A 08/24/2013    Procedure: PERMANENT PACEMAKER INSERTION;  Surgeon: Gardiner Rhyme, MD;  Location: [facility] CATH LAB;  Service: Cardiovascular;  Laterality: N/A;  . Left heart catheterization with coronary angiogram N/A 04/08/2014    Procedure: LEFT HEART CATHETERIZATION WITH CORONARY ANGIOGRAM;  Surgeon: Lesleigh Noe, MD;  Location: [facility] CATH LAB;  Service: Cardiovascular;  Laterality: N/A;    History   Social History  . Marital Status: Married    Spouse Name: N/A    Number of Children: 2  . Years of Education: N/A   Occupational History  .  material handler    Social History Main Topics  . Smoking status: Never Smoker   . Smokeless tobacco: Never Used  . Alcohol Use: 0.0 oz/week    0 Not specified per week     Comment: 08/24/2013 "maybe couple drinks/year"  . Drug Use: No  . Sexual Activity: Yes   Other Topics Concern  . Not on file   Social History Narrative     No family history of premature CAD in 1st degree relatives.  Prior to Admission medications   Medication Sig Start Date End Date Taking? Authorizing Provider  nitroGLYCERIN (NITROSTAT) 0.4 MG SL tablet Place 0.4 mg under the tongue every 5  (five) minutes as needed for chest pain.   Yes Historical Provider, MD     Review of systems complete and found to be negative unless listed above in HPI     Physical exam Blood pressure 122/72, pulse 75, height 5\' 9"  (1.753 m), weight 157 lb (71.215 kg), SpO2 99 %. General: NAD Neck: No JVD, no thyromegaly or thyroid nodule.  Lungs: Clear to auscultation bilaterally with normal respiratory effort. CV: Nondisplaced PMI. Regular rate and rhythm, normal S1/S2, no S3/S4, no murmur.  No peripheral edema.  No carotid bruit.  Normal pedal pulses.  Abdomen: Soft, nontender, no hepatosplenomegaly, no distention.  Skin: Intact without lesions or rashes.  Neurologic: Alert and oriented x 3.  Psych: Normal affect. Extremities: No clubbing or cyanosis.  HEENT: Normal.   ECG: Most recent ECG reviewed.  Labs:   Lab Results  Component Value Date   WBC 4.8 04/08/2014   HGB 13.6 04/08/2014   HCT 39.9 04/08/2014   MCV 82.3 04/08/2014   PLT 190 04/08/2014   No results for input(s): NA, K, CL, CO2, BUN, CREATININE, CALCIUM, PROT, BILITOT, ALKPHOS, ALT, AST, GLUCOSE in the last 168 hours.  Invalid input(s): LABALBU Lab Results  Component Value Date   CKTOTAL 112 08/18/2013   CKMB <0.7 08/18/2013   No results found for: CHOL No results found for: HDL No results found for: LDLCALC No results found for: TRIG No results found for: CHOLHDL No results found for: LDLDIRECT       Studies: No results found.  ASSESSMENT AND PLAN:  1. Chest pain: It appears atypical. I will review hospital records once they are made available. It is possible he experienced pain to due intramyocardial bridging. I have encouraged him to try at least one SL nitroglycerin should he experience significant chest pain. I think he can safely return to work. 2. Pacemaker for SSS: Normal device function on 10/21/14. Follows with Dr. Johney FrameAllred.  Dispo: f/u with me prn.  Time spent: 40 minutes, of which >50% spent  reviewing details of patient's hospitalization and prior cardiac studies with patient.   Signed: Prentice DockerSuresh Koneswaran, M.D., F.A.C.C.  11/10/2014, 10:14 AM  ADDENDUM: I have reviewed the discharge summary from his hospitalization as well as all pertinent studies. The physician's notes mention that he had chest wall tenderness to palpation. Two serial troponins, ECG, and chest x-ray were all normal. ECG demonstrated an atrial paced rhythm, heart rate 76 bpm. Renal function and BNP were also found to be normal as well as CBC. As stated previously, he can follow-up with me as needed.

## 2014-11-10 NOTE — Patient Instructions (Signed)
Continue all other medications.   Follow up as needed Note given today to return to work.

## 2015-01-19 ENCOUNTER — Ambulatory Visit (INDEPENDENT_AMBULATORY_CARE_PROVIDER_SITE_OTHER): Payer: BLUE CROSS/BLUE SHIELD | Admitting: *Deleted

## 2015-01-19 DIAGNOSIS — R001 Bradycardia, unspecified: Secondary | ICD-10-CM

## 2015-01-19 LAB — MDC_IDC_ENUM_SESS_TYPE_REMOTE
Brady Statistic RV Percent Paced: 0 %
Implantable Pulse Generator Serial Number: 68069787
Lead Channel Impedance Value: 527 Ohm
Lead Channel Pacing Threshold Amplitude: 0.7 V
Lead Channel Pacing Threshold Amplitude: 0.9 V
Lead Channel Pacing Threshold Pulse Width: 0.4 ms
Lead Channel Setting Pacing Amplitude: 1.7 V
Lead Channel Setting Pacing Pulse Width: 0.4 ms
MDC IDC MSMT LEADCHNL RA SENSING INTR AMPL: 3.1 mV
MDC IDC MSMT LEADCHNL RV IMPEDANCE VALUE: 644 Ohm
MDC IDC MSMT LEADCHNL RV PACING THRESHOLD PULSEWIDTH: 0.4 ms
MDC IDC MSMT LEADCHNL RV SENSING INTR AMPL: 13.7 mV
MDC IDC PG MODEL: 359529
MDC IDC SET LEADCHNL RV PACING AMPLITUDE: 1.9 V
MDC IDC STAT BRADY RA PERCENT PACED: 77 %

## 2015-01-19 NOTE — Progress Notes (Signed)
Remote pacemaker transmission.   

## 2015-02-07 ENCOUNTER — Telehealth: Payer: Self-pay | Admitting: Internal Medicine

## 2015-02-07 NOTE — Telephone Encounter (Signed)
Left message for office not on any medications to stop and does not need SBE.

## 2015-02-07 NOTE — Telephone Encounter (Signed)
Request for surgical clearance:  1. What type of surgery is being performed? cleaning   2. When is this surgery scheduled? tomrrow 4/27   3. Are there any medications that need to be held prior to surgery and how long?  4. Name of physician performing surgery? Dowdy   5. What is your office phone and fax number?   6.

## 2015-02-14 ENCOUNTER — Encounter: Payer: Self-pay | Admitting: Cardiology

## 2015-02-20 ENCOUNTER — Encounter: Payer: Self-pay | Admitting: Internal Medicine

## 2015-04-24 ENCOUNTER — Ambulatory Visit (INDEPENDENT_AMBULATORY_CARE_PROVIDER_SITE_OTHER): Payer: BLUE CROSS/BLUE SHIELD | Admitting: *Deleted

## 2015-04-24 ENCOUNTER — Other Ambulatory Visit: Payer: Self-pay | Admitting: Internal Medicine

## 2015-04-24 DIAGNOSIS — R001 Bradycardia, unspecified: Secondary | ICD-10-CM

## 2015-04-24 LAB — CUP PACEART REMOTE DEVICE CHECK
Brady Statistic RA Percent Paced: 78 %
Brady Statistic RV Percent Paced: 0 %
Date Time Interrogation Session: 20160714122803
Lead Channel Impedance Value: 546 Ohm
Lead Channel Impedance Value: 624 Ohm
Lead Channel Pacing Threshold Amplitude: 0.8 V
Lead Channel Sensing Intrinsic Amplitude: 14.2 mV
Lead Channel Setting Pacing Amplitude: 1.7 V
MDC IDC MSMT LEADCHNL RA SENSING INTR AMPL: 3.2 mV
MDC IDC MSMT LEADCHNL RV PACING THRESHOLD AMPLITUDE: 0.9 V
MDC IDC SET LEADCHNL RV PACING AMPLITUDE: 1.9 V
MDC IDC SET LEADCHNL RV PACING PULSEWIDTH: 0.4 ms
Pulse Gen Model: 359529
Pulse Gen Serial Number: 68069787

## 2015-04-25 NOTE — Progress Notes (Signed)
Remote pacemaker transmission.   

## 2015-05-19 ENCOUNTER — Encounter: Payer: Self-pay | Admitting: Cardiology

## 2015-05-23 ENCOUNTER — Encounter: Payer: Self-pay | Admitting: Internal Medicine

## 2015-07-24 ENCOUNTER — Encounter: Payer: Self-pay | Admitting: Internal Medicine

## 2015-07-24 ENCOUNTER — Ambulatory Visit (INDEPENDENT_AMBULATORY_CARE_PROVIDER_SITE_OTHER): Payer: BLUE CROSS/BLUE SHIELD | Admitting: *Deleted

## 2015-07-24 DIAGNOSIS — R001 Bradycardia, unspecified: Secondary | ICD-10-CM

## 2015-07-24 NOTE — Progress Notes (Signed)
Remote pacemaker transmission.   

## 2015-07-25 LAB — CUP PACEART REMOTE DEVICE CHECK
Brady Statistic RV Percent Paced: 0 %
Lead Channel Impedance Value: 488 Ohm
Lead Channel Impedance Value: 683 Ohm
Lead Channel Pacing Threshold Amplitude: 0.7 V
Lead Channel Pacing Threshold Amplitude: 1 V
Lead Channel Pacing Threshold Pulse Width: 0.4 ms
Lead Channel Pacing Threshold Pulse Width: 0.4 ms
Lead Channel Sensing Intrinsic Amplitude: 14.5 mV
Lead Channel Setting Pacing Amplitude: 1.9 V
MDC IDC MSMT LEADCHNL RA SENSING INTR AMPL: 3 mV
MDC IDC SESS DTM: 20161011102826
MDC IDC SET LEADCHNL RA PACING AMPLITUDE: 1.7 V
MDC IDC SET LEADCHNL RV PACING PULSEWIDTH: 0.4 ms
MDC IDC STAT BRADY RA PERCENT PACED: 79 %
Pulse Gen Model: 359529
Pulse Gen Serial Number: 68069787

## 2015-08-25 ENCOUNTER — Encounter: Payer: Self-pay | Admitting: Cardiology

## 2015-12-12 ENCOUNTER — Encounter: Payer: Self-pay | Admitting: Internal Medicine

## 2015-12-12 ENCOUNTER — Ambulatory Visit (INDEPENDENT_AMBULATORY_CARE_PROVIDER_SITE_OTHER): Payer: BLUE CROSS/BLUE SHIELD | Admitting: Internal Medicine

## 2015-12-12 VITALS — BP 122/73 | HR 70 | Ht 69.0 in | Wt 163.0 lb

## 2015-12-12 DIAGNOSIS — R001 Bradycardia, unspecified: Secondary | ICD-10-CM | POA: Diagnosis not present

## 2015-12-12 DIAGNOSIS — Z950 Presence of cardiac pacemaker: Secondary | ICD-10-CM | POA: Diagnosis not present

## 2015-12-12 LAB — CUP PACEART INCLINIC DEVICE CHECK
Brady Statistic RA Percent Paced: 76 %
Implantable Lead Implant Date: 20141111
Implantable Lead Location: 753859
Implantable Lead Location: 753860
Implantable Lead Model: 350973
Implantable Lead Model: 350975
Implantable Lead Serial Number: 29497144
Lead Channel Impedance Value: 507 Ohm
Lead Channel Impedance Value: 663 Ohm
Lead Channel Pacing Threshold Amplitude: 0.5 V
Lead Channel Pacing Threshold Amplitude: 0.5 V
Lead Channel Pacing Threshold Amplitude: 1 V
Lead Channel Pacing Threshold Amplitude: 1 V
Lead Channel Pacing Threshold Pulse Width: 0.4 ms
Lead Channel Pacing Threshold Pulse Width: 0.4 ms
Lead Channel Pacing Threshold Pulse Width: 0.4 ms
Lead Channel Sensing Intrinsic Amplitude: 3.5 mV
Lead Channel Setting Pacing Amplitude: 1.9 V
Lead Channel Setting Pacing Pulse Width: 0.4 ms
MDC IDC LEAD IMPLANT DT: 20141111
MDC IDC LEAD SERIAL: 29446968
MDC IDC MSMT LEADCHNL RA PACING THRESHOLD PULSEWIDTH: 0.4 ms
MDC IDC MSMT LEADCHNL RV SENSING INTR AMPL: 10.5 mV
MDC IDC PG SERIAL: 68069787
MDC IDC SESS DTM: 20170228143900
MDC IDC SET LEADCHNL RA PACING AMPLITUDE: 1.7 V
MDC IDC STAT BRADY RV PERCENT PACED: 0 %

## 2015-12-12 NOTE — Patient Instructions (Signed)
Your physician recommends that you continue on your current medications as directed. Please refer to the Current Medication list given to you today. Device check on 03/12/16. Your physician recommends that you schedule a follow-up appointment in: 1 year with Dr. Johney Frame. Please schedule this appointment today before you leave the office.

## 2015-12-12 NOTE — Progress Notes (Signed)
PCP: Estanislado Pandy, MD  John [name] is a 50 y.o. male who presents today for routine electrophysiology followup.  Since last being seen in EP clinic, the patient reports doing very well.  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  He has a 52 year old and 6 week old grandchildren that live in [place].  He enjoys seeing them.   Today, he denies symptoms of palpitations, CP, SOB,  lower extremity edema, dizziness, presyncope, or syncope.  The patient is otherwise without complaint today.   Past Medical History  Diagnosis Date  . Sinus bradycardia 08/12/2013  . Family history of anesthesia complication     "Mom; forgot what happens" (08/24/2013)  . Dyspnea on exertion     "since ~ 05/2013" (08/24/2013)  . Chronic lower back pain     "q hs when I lay down in bed" (08/24/2013)   Past Surgical History  Procedure Laterality Date  . Back surgery  1992-94    ruptured disc  . Pacemaker insertion  08/24/2013    Biotronik dual chamber PPM implanted by Dr Johney Frame  . Tonsillectomy and adenoidectomy  ~ 1977  . Lumbar disc surgery  1992; 1994    "disc ruptured twice" (08/24/2013)  . Permanent pacemaker insertion N/A 08/24/2013    Procedure: PERMANENT PACEMAKER INSERTION;  Surgeon: Gardiner Rhyme, MD;  Location: [facility] CATH LAB;  Service: Cardiovascular;  Laterality: N/A;  . Left heart catheterization with coronary angiogram N/A 04/08/2014    Procedure: LEFT HEART CATHETERIZATION WITH CORONARY ANGIOGRAM;  Surgeon: Lesleigh Noe, MD;  Location: [facility] CATH LAB;  Service: Cardiovascular;  Laterality: N/A;    ROS- all systems are reviewed and negative except as per HPI above  Current Outpatient Prescriptions  Medication Sig Dispense Refill  . nitroGLYCERIN (NITROSTAT) 0.4 MG SL tablet Place 0.4 mg under the tongue every 5 (five) minutes as needed for chest pain.     No current facility-administered medications for this visit.    Physical Exam: Filed Vitals:    12/12/15 0923  BP: 122/73  Pulse: 70  Height:  (1.753 m)  Weight: 163 lb (73.936 kg)    GEN- The patient is well appearing, alert and oriented x 3 today.   Head- normocephalic, atraumatic Eyes-  Sclera clear, conjunctiva pink Ears- hearing intact Oropharynx- clear Lungs- Clear to ausculation bilaterally, normal work of breathing Chest- pacemaker pocket is well healed Heart- Regular rate and rhythm, no murmurs, rubs or gallops, PMI not laterally displaced GI- soft, NT, ND, + BS Extremities- no clubbing, cyanosis, or edema  Pacemaker interrogation- reviewed in detail today,  See PACEART report  Assessment and Plan:  1. Sick sinus syndrome Normal pacemaker function See Pace Art report No changes today  2. SOB/ CP Resolved No further workup planned  Remote monitoring Return in 1 year  Hillis Range MD, [facility] 12/12/2015 10:03 AM

## 2015-12-15 ENCOUNTER — Encounter: Payer: BLUE CROSS/BLUE SHIELD | Admitting: Internal Medicine

## 2016-03-12 ENCOUNTER — Ambulatory Visit (INDEPENDENT_AMBULATORY_CARE_PROVIDER_SITE_OTHER): Payer: BLUE CROSS/BLUE SHIELD | Admitting: *Deleted

## 2016-03-12 DIAGNOSIS — R001 Bradycardia, unspecified: Secondary | ICD-10-CM

## 2016-03-12 DIAGNOSIS — Z950 Presence of cardiac pacemaker: Secondary | ICD-10-CM

## 2016-03-13 NOTE — Progress Notes (Signed)
Remote pacemaker transmission.   

## 2016-03-28 LAB — CUP PACEART REMOTE DEVICE CHECK
Brady Statistic RA Percent Paced: 77 %
Brady Statistic RV Percent Paced: 0 %
Implantable Lead Implant Date: 20141111
Implantable Lead Location: 753859
Implantable Lead Model: 350973
Implantable Lead Model: 350975
Implantable Lead Serial Number: 29497144
Lead Channel Impedance Value: 527 Ohm
Lead Channel Impedance Value: 644 Ohm
Lead Channel Pacing Threshold Amplitude: 0.7 V
Lead Channel Sensing Intrinsic Amplitude: 3 mV
Lead Channel Setting Pacing Amplitude: 1.9 V
Lead Channel Setting Pacing Pulse Width: 0.4 ms
MDC IDC LEAD IMPLANT DT: 20141111
MDC IDC LEAD LOCATION: 753860
MDC IDC LEAD SERIAL: 29446968
MDC IDC MSMT LEADCHNL RA PACING THRESHOLD PULSEWIDTH: 0.4 ms
MDC IDC MSMT LEADCHNL RV PACING THRESHOLD AMPLITUDE: 0.8 V
MDC IDC MSMT LEADCHNL RV PACING THRESHOLD PULSEWIDTH: 0.4 ms
MDC IDC MSMT LEADCHNL RV SENSING INTR AMPL: 14.2 mV
MDC IDC SESS DTM: 20170615155058
MDC IDC SET LEADCHNL RA PACING AMPLITUDE: 1.7 V
Pulse Gen Serial Number: 68069787

## 2016-04-02 ENCOUNTER — Encounter: Payer: Self-pay | Admitting: Cardiology

## 2016-06-11 ENCOUNTER — Ambulatory Visit (INDEPENDENT_AMBULATORY_CARE_PROVIDER_SITE_OTHER): Payer: BLUE CROSS/BLUE SHIELD | Admitting: *Deleted

## 2016-06-11 DIAGNOSIS — Z950 Presence of cardiac pacemaker: Secondary | ICD-10-CM

## 2016-06-11 DIAGNOSIS — R001 Bradycardia, unspecified: Secondary | ICD-10-CM

## 2016-06-12 NOTE — Progress Notes (Signed)
Remote pacemaker transmission.   

## 2016-06-13 ENCOUNTER — Encounter: Payer: Self-pay | Admitting: Cardiology

## 2016-07-04 LAB — CUP PACEART REMOTE DEVICE CHECK
Brady Statistic RA Percent Paced: 77 %
Brady Statistic RV Percent Paced: 0 %
Implantable Lead Implant Date: 20141111
Implantable Lead Location: 753859
Implantable Lead Location: 753860
Implantable Lead Model: 350973
Implantable Lead Model: 350975
Implantable Lead Serial Number: 29446968
Implantable Lead Serial Number: 29497144
Lead Channel Pacing Threshold Amplitude: 0.7 V
Lead Channel Pacing Threshold Amplitude: 0.8 V
Lead Channel Pacing Threshold Pulse Width: 0.4 ms
Lead Channel Pacing Threshold Pulse Width: 0.4 ms
Lead Channel Sensing Intrinsic Amplitude: 1.7 mV
Lead Channel Sensing Intrinsic Amplitude: 11 mV
Lead Channel Setting Pacing Amplitude: 1.7 V
Lead Channel Setting Pacing Amplitude: 1.8 V
MDC IDC LEAD IMPLANT DT: 20141111
MDC IDC MSMT LEADCHNL RA IMPEDANCE VALUE: 507 Ohm
MDC IDC MSMT LEADCHNL RV IMPEDANCE VALUE: 663 Ohm
MDC IDC SESS DTM: 20170921133007
MDC IDC SET LEADCHNL RV PACING PULSEWIDTH: 0.4 ms
Pulse Gen Serial Number: 68069787

## 2016-09-10 ENCOUNTER — Ambulatory Visit (INDEPENDENT_AMBULATORY_CARE_PROVIDER_SITE_OTHER): Payer: BLUE CROSS/BLUE SHIELD | Admitting: *Deleted

## 2016-09-10 DIAGNOSIS — R001 Bradycardia, unspecified: Secondary | ICD-10-CM

## 2016-09-11 NOTE — Progress Notes (Signed)
Remote pacemaker transmission.   

## 2016-09-12 ENCOUNTER — Encounter: Payer: Self-pay | Admitting: Cardiology

## 2016-10-17 LAB — CUP PACEART REMOTE DEVICE CHECK
Brady Statistic RA Percent Paced: 77 %
Brady Statistic RV Percent Paced: 0 %
Implantable Lead Implant Date: 20141111
Implantable Lead Location: 753859
Implantable Lead Model: 350973
Implantable Lead Serial Number: 29497144
Lead Channel Impedance Value: 488 Ohm
Lead Channel Pacing Threshold Amplitude: 0.7 V
Lead Channel Pacing Threshold Pulse Width: 0.4 ms
Lead Channel Pacing Threshold Pulse Width: 0.4 ms
Lead Channel Sensing Intrinsic Amplitude: 14.1 mV
Lead Channel Setting Pacing Amplitude: 1.9 V
Lead Channel Setting Pacing Pulse Width: 0.4 ms
MDC IDC LEAD IMPLANT DT: 20141111
MDC IDC LEAD LOCATION: 753860
MDC IDC LEAD MODEL: 350975
MDC IDC LEAD SERIAL: 29446968
MDC IDC MSMT LEADCHNL RA SENSING INTR AMPL: 3 mV
MDC IDC MSMT LEADCHNL RV IMPEDANCE VALUE: 624 Ohm
MDC IDC MSMT LEADCHNL RV PACING THRESHOLD AMPLITUDE: 0.8 V
MDC IDC PG IMPLANT DT: 20141111
MDC IDC SESS DTM: 20180104093651
MDC IDC SET LEADCHNL RA PACING AMPLITUDE: 1.7 V
Pulse Gen Serial Number: 68069787

## 2016-12-13 ENCOUNTER — Ambulatory Visit: Payer: BLUE CROSS/BLUE SHIELD | Admitting: Internal Medicine

## 2016-12-20 ENCOUNTER — Encounter: Payer: Self-pay | Admitting: Internal Medicine

## 2016-12-20 ENCOUNTER — Ambulatory Visit (INDEPENDENT_AMBULATORY_CARE_PROVIDER_SITE_OTHER): Payer: Managed Care, Other (non HMO) | Admitting: Internal Medicine

## 2016-12-20 DIAGNOSIS — R001 Bradycardia, unspecified: Secondary | ICD-10-CM | POA: Diagnosis not present

## 2016-12-20 NOTE — Progress Notes (Signed)
   PCP: Estanislado PandySASSER,PAUL W, MD  Barbette HairRobert D [name] is a 51 y.o. male who presents today for routine electrophysiology followup.  Since last being seen in EP clinic, the patient reports doing very well.   He has a 53 year old and 1 old grandchildren that live in [place].  He just had another grandchild in February.   He enjoys seeing them.  Today, he denies symptoms of palpitations, CP, SOB,  lower extremity edema, dizziness, presyncope, or syncope.  The patient is otherwise without complaint today.   Past Medical History:  Diagnosis Date  . Chronic lower back pain    "q hs when I lay down in bed" (08/24/2013)  . Dyspnea on exertion    "since ~ 05/2013" (08/24/2013)  . Family history of anesthesia complication    "Mom; forgot what happens" (08/24/2013)  . Sinus bradycardia 08/12/2013   Past Surgical History:  Procedure Laterality Date  . BACK SURGERY  1992-94   ruptured disc  . LEFT HEART CATHETERIZATION WITH CORONARY ANGIOGRAM N/A 04/08/2014   Procedure: LEFT HEART CATHETERIZATION WITH CORONARY ANGIOGRAM;  Surgeon: Lesleigh NoeHenry W Smith III, MD;  [facility];  Service: Cardiovascular;  Laterality: N/A;  . LUMBAR DISC SURGERY  1992; 1994   "disc ruptured twice" (08/24/2013)  . PACEMAKER INSERTION  08/24/2013   Biotronik dual chamber PPM implanted by Dr Johney FrameAllred  . PERMANENT PACEMAKER INSERTION N/A 08/24/2013   Procedure: PERMANENT PACEMAKER INSERTION;  Surgeon: Gardiner RhymeJames D Teka Chanda, MD;  Location: [facility] CATH LAB;  Service: Cardiovascular;  Laterality: N/A;  . TONSILLECTOMY AND ADENOIDECTOMY  ~ 1977    ROS- all systems are reviewed and negative except as per HPI above  Current Outpatient Prescriptions  Medication Sig Dispense Refill  . nitroGLYCERIN (NITROSTAT) 0.4 MG SL tablet Place 0.4 mg under the tongue every 5 (five) minutes as needed for chest pain.     No current facility-administered medications for this visit.     Physical Exam: Vitals:   12/20/16 1021  BP: 111/68  Pulse: 75  SpO2: 96%    Weight: 159 lb 6.4 oz (72.3 kg)  Height: 5\' 9"  (1.753 m)    GEN- The patient is well appearing, alert and oriented x 3 today.   Head- normocephalic, atraumatic Eyes-  Sclera clear, conjunctiva pink Ears- hearing intact Oropharynx- clear Lungs- Clear to ausculation bilaterally, normal work of breathing Chest- pacemaker pocket is well healed Heart- Regular rate and rhythm, no murmurs, rubs or gallops, PMI not laterally displaced GI- soft, NT, ND, + BS Extremities- no clubbing, cyanosis, or edema  Pacemaker interrogation- reviewed in detail today,  See PACEART report  Assessment and Plan:  1. Sick sinus syndrome Normal pacemaker function See Pace Art report No changes today Rare 1:1 tachycardia, sinus tach vs SVT, lasting typically < 30 seconds but occasionally longer  2. SOB rare No further workup planned  Remote monitoring Return in 1 year  Hillis RangeJames Rutha Melgoza MD, [facility] 12/20/2016 10:41 AM

## 2016-12-20 NOTE — Patient Instructions (Signed)
Medication Instructions:  Continue all current medications.  Labwork: none  Testing/Procedures: none  Follow-Up: Your physician wants you to follow up in:  1 year.  You will receive a reminder letter in the mail one-two months in advance.  If you don't receive a letter, please call our office to schedule the follow up appointment   Any Other Special Instructions Will Be Listed Below (If Applicable). Remote monitoring is used to monitor your Pacemaker of ICD from home. This monitoring reduces the number of office visits required to check your device to one time per year. It allows us to keep an eye on the functioning of your device to ensure it is working properly. You are scheduled for a device check from home on 03/24/2017. You may send your transmission at any time that day. If you have a wireless device, the transmission will be sent automatically. After your physician reviews your transmission, you will receive a postcard with your next transmission date.  If you need a refill on your cardiac medications before your next appointment, please call your pharmacy.  

## 2016-12-24 LAB — CUP PACEART INCLINIC DEVICE CHECK
Battery Remaining Longevity: 96 mo
Brady Statistic RA Percent Paced: 76 %
Brady Statistic RV Percent Paced: 0 %
Implantable Lead Implant Date: 20141111
Implantable Lead Model: 350
Implantable Lead Model: 350
Implantable Lead Serial Number: 29497144
Lead Channel Impedance Value: 526 Ohm
Lead Channel Pacing Threshold Amplitude: 0.7 V
Lead Channel Pacing Threshold Pulse Width: 0.4 ms
Lead Channel Pacing Threshold Pulse Width: 0.4 ms
Lead Channel Setting Pacing Amplitude: 1.5 V
Lead Channel Setting Pacing Amplitude: 1.7 V
MDC IDC LEAD IMPLANT DT: 20141111
MDC IDC LEAD LOCATION: 753859
MDC IDC LEAD LOCATION: 753860
MDC IDC LEAD SERIAL: 29446968
MDC IDC MSMT LEADCHNL RA PACING THRESHOLD AMPLITUDE: 0.5 V
MDC IDC MSMT LEADCHNL RA SENSING INTR AMPL: 3.2 mV
MDC IDC MSMT LEADCHNL RV IMPEDANCE VALUE: 604 Ohm
MDC IDC MSMT LEADCHNL RV SENSING INTR AMPL: 11.4 mV
MDC IDC PG IMPLANT DT: 20141111
MDC IDC PG SERIAL: 68069787
MDC IDC SESS DTM: 20180313144452
MDC IDC SET LEADCHNL RV PACING PULSEWIDTH: 0.4 ms

## 2017-03-24 ENCOUNTER — Ambulatory Visit (INDEPENDENT_AMBULATORY_CARE_PROVIDER_SITE_OTHER): Payer: Managed Care, Other (non HMO) | Admitting: *Deleted

## 2017-03-24 DIAGNOSIS — R001 Bradycardia, unspecified: Secondary | ICD-10-CM | POA: Diagnosis not present

## 2017-03-24 NOTE — Progress Notes (Signed)
Remote pacemaker transmission.   

## 2017-03-26 ENCOUNTER — Encounter: Payer: Self-pay | Admitting: Cardiology

## 2017-03-26 LAB — CUP PACEART REMOTE DEVICE CHECK
Brady Statistic RA Percent Paced: 74 %
Implantable Lead Implant Date: 20141111
Implantable Lead Location: 753859
Implantable Lead Model: 350
Implantable Lead Model: 350
Implantable Lead Serial Number: 29446968
Implantable Pulse Generator Implant Date: 20141111
Lead Channel Impedance Value: 468 Ohm
Lead Channel Impedance Value: 624 Ohm
Lead Channel Pacing Threshold Amplitude: 0.8 V
Lead Channel Pacing Threshold Pulse Width: 0.4 ms
Lead Channel Sensing Intrinsic Amplitude: 2.7 mV
MDC IDC LEAD IMPLANT DT: 20141111
MDC IDC LEAD LOCATION: 753860
MDC IDC LEAD SERIAL: 29497144
MDC IDC MSMT LEADCHNL RA PACING THRESHOLD AMPLITUDE: 0.7 V
MDC IDC MSMT LEADCHNL RV PACING THRESHOLD PULSEWIDTH: 0.4 ms
MDC IDC MSMT LEADCHNL RV SENSING INTR AMPL: 13 mV
MDC IDC SESS DTM: 20180613111816
MDC IDC STAT BRADY RV PERCENT PACED: 0 %
Pulse Gen Serial Number: 68069787

## 2017-06-23 ENCOUNTER — Ambulatory Visit (INDEPENDENT_AMBULATORY_CARE_PROVIDER_SITE_OTHER): Payer: Managed Care, Other (non HMO) | Admitting: *Deleted

## 2017-06-23 DIAGNOSIS — R001 Bradycardia, unspecified: Secondary | ICD-10-CM | POA: Diagnosis not present

## 2017-06-23 NOTE — Progress Notes (Signed)
Remote pacemaker transmission.   

## 2017-06-25 ENCOUNTER — Encounter: Payer: Self-pay | Admitting: Cardiology

## 2017-06-25 LAB — CUP PACEART REMOTE DEVICE CHECK
Brady Statistic AP VP Percent: 0 %
Brady Statistic AS VS Percent: 31 %
Brady Statistic RA Percent Paced: 68 %
Brady Statistic RV Percent Paced: 0 %
Implantable Lead Implant Date: 20141111
Implantable Lead Location: 753859
Implantable Lead Location: 753860
Implantable Lead Model: 350
Implantable Lead Model: 350
Implantable Lead Serial Number: 29446968
Implantable Lead Serial Number: 29497144
Implantable Pulse Generator Implant Date: 20141111
Lead Channel Impedance Value: 485 Ohm
Lead Channel Pacing Threshold Amplitude: 0.8 V
Lead Channel Pacing Threshold Pulse Width: 0.4 ms
Lead Channel Setting Pacing Pulse Width: 0.4 ms
MDC IDC LEAD IMPLANT DT: 20141111
MDC IDC MSMT LEADCHNL RA PACING THRESHOLD AMPLITUDE: 0.6 V
MDC IDC MSMT LEADCHNL RV IMPEDANCE VALUE: 639 Ohm
MDC IDC MSMT LEADCHNL RV PACING THRESHOLD PULSEWIDTH: 0.4 ms
MDC IDC PG SERIAL: 68069787
MDC IDC SESS DTM: 20180912055032
MDC IDC STAT BRADY AP VS PERCENT: 68 %
MDC IDC STAT BRADY AS VP PERCENT: 0 %

## 2017-07-04 ENCOUNTER — Other Ambulatory Visit: Payer: Self-pay | Admitting: Nurse Practitioner

## 2017-07-04 DIAGNOSIS — M4722 Other spondylosis with radiculopathy, cervical region: Secondary | ICD-10-CM

## 2017-07-16 ENCOUNTER — Ambulatory Visit
Admission: RE | Admit: 2017-07-16 | Discharge: 2017-07-16 | Disposition: A | Discharge status: Home / Self Care | Payer: Managed Care, Other (non HMO) | Source: Ambulatory Visit | Attending: Nurse Practitioner | Admitting: Nurse Practitioner

## 2017-07-16 DIAGNOSIS — M4722 Other spondylosis with radiculopathy, cervical region: Secondary | ICD-10-CM

## 2017-07-16 MED ORDER — TRIAMCINOLONE ACETONIDE 40 MG/ML IJ SUSP (RADIOLOGY)
60.0000 mg | Freq: Once | INTRAMUSCULAR | Status: AC
  Administered 2017-07-16: 60 mg via EPIDURAL

## 2017-07-16 MED ORDER — IOPAMIDOL (ISOVUE-M 300) INJECTION 61%
1.0000 mL | Freq: Once | INTRAMUSCULAR | Status: AC | PRN
  Administered 2017-07-16: 1 mL via EPIDURAL

## 2017-07-16 NOTE — Discharge Instructions (Signed)

## 2017-08-18 ENCOUNTER — Other Ambulatory Visit: Payer: Self-pay | Admitting: Neurosurgery

## 2017-08-18 DIAGNOSIS — M4722 Other spondylosis with radiculopathy, cervical region: Secondary | ICD-10-CM

## 2017-08-28 ENCOUNTER — Ambulatory Visit
Admission: RE | Admit: 2017-08-28 | Discharge: 2017-08-28 | Disposition: A | Discharge status: Home / Self Care | Payer: Managed Care, Other (non HMO) | Source: Ambulatory Visit | Attending: Neurosurgery | Admitting: Neurosurgery

## 2017-08-28 DIAGNOSIS — M4722 Other spondylosis with radiculopathy, cervical region: Secondary | ICD-10-CM

## 2017-08-28 MED ORDER — IOPAMIDOL (ISOVUE-M 300) INJECTION 61%
10.0000 mL | Freq: Once | INTRAMUSCULAR | Status: AC | PRN
  Administered 2017-08-28: 10 mL via INTRATHECAL

## 2017-08-28 MED ORDER — MEPERIDINE HCL 100 MG/ML IJ SOLN
75.0000 mg | Freq: Once | INTRAMUSCULAR | Status: AC
  Administered 2017-08-28: 75 mg via INTRAMUSCULAR

## 2017-08-28 MED ORDER — ONDANSETRON HCL 4 MG/2ML IJ SOLN: 4.0000 mg | Freq: Four times a day (QID) | INTRAMUSCULAR | Status: DC | PRN

## 2017-08-28 MED ORDER — ONDANSETRON HCL 4 MG/2ML IJ SOLN
4.0000 mg | Freq: Once | INTRAMUSCULAR | Status: AC
  Administered 2017-08-28: 4 mg via INTRAMUSCULAR

## 2017-08-28 MED ORDER — DIAZEPAM 5 MG PO TABS
10.0000 mg | ORAL_TABLET | Freq: Once | ORAL | Status: AC
  Administered 2017-08-28: 5 mg via ORAL

## 2017-08-28 NOTE — Discharge Instructions (Signed)

## 2017-09-22 ENCOUNTER — Ambulatory Visit (INDEPENDENT_AMBULATORY_CARE_PROVIDER_SITE_OTHER): Payer: Managed Care, Other (non HMO) | Admitting: *Deleted

## 2017-09-22 DIAGNOSIS — R001 Bradycardia, unspecified: Secondary | ICD-10-CM

## 2017-09-22 NOTE — Progress Notes (Signed)
Remote pacemaker transmission.   

## 2017-09-26 ENCOUNTER — Encounter: Payer: Self-pay | Admitting: Cardiology

## 2017-10-09 LAB — CUP PACEART REMOTE DEVICE CHECK
Brady Statistic RA Percent Paced: 72 %
Brady Statistic RV Percent Paced: 0 %
Date Time Interrogation Session: 20181227052718
Implantable Lead Implant Date: 20141111
Implantable Lead Location: 753860
Implantable Lead Model: 350
Lead Channel Pacing Threshold Pulse Width: 0.4 ms
Lead Channel Setting Pacing Pulse Width: 0.4 ms
MDC IDC LEAD IMPLANT DT: 20141111
MDC IDC LEAD LOCATION: 753859
MDC IDC LEAD SERIAL: 29446968
MDC IDC LEAD SERIAL: 29497144
MDC IDC MSMT LEADCHNL RA IMPEDANCE VALUE: 484 Ohm
MDC IDC MSMT LEADCHNL RA PACING THRESHOLD AMPLITUDE: 0.6 V
MDC IDC MSMT LEADCHNL RA PACING THRESHOLD PULSEWIDTH: 0.4 ms
MDC IDC MSMT LEADCHNL RV IMPEDANCE VALUE: 636 Ohm
MDC IDC MSMT LEADCHNL RV PACING THRESHOLD AMPLITUDE: 0.8 V
MDC IDC PG IMPLANT DT: 20141111
MDC IDC PG SERIAL: 68069787
MDC IDC STAT BRADY AP VP PERCENT: 0 %
MDC IDC STAT BRADY AP VS PERCENT: 72 %
MDC IDC STAT BRADY AS VP PERCENT: 0 %
MDC IDC STAT BRADY AS VS PERCENT: 28 %

## 2017-10-27 ENCOUNTER — Other Ambulatory Visit: Payer: Self-pay | Admitting: Neurosurgery

## 2017-10-27 DIAGNOSIS — M75101 Unspecified rotator cuff tear or rupture of right shoulder, not specified as traumatic: Secondary | ICD-10-CM

## 2017-11-07 ENCOUNTER — Ambulatory Visit
Admission: RE | Admit: 2017-11-07 | Discharge: 2017-11-07 | Disposition: A | Discharge status: Home / Self Care | Payer: Managed Care, Other (non HMO) | Source: Ambulatory Visit | Attending: Neurosurgery | Admitting: Neurosurgery

## 2017-11-07 DIAGNOSIS — M75101 Unspecified rotator cuff tear or rupture of right shoulder, not specified as traumatic: Secondary | ICD-10-CM

## 2017-12-22 ENCOUNTER — Ambulatory Visit (INDEPENDENT_AMBULATORY_CARE_PROVIDER_SITE_OTHER): Payer: Managed Care, Other (non HMO) | Admitting: *Deleted

## 2017-12-22 DIAGNOSIS — R001 Bradycardia, unspecified: Secondary | ICD-10-CM

## 2017-12-22 NOTE — Progress Notes (Signed)
Remote pacemaker transmission.   

## 2017-12-24 ENCOUNTER — Encounter: Payer: Self-pay | Admitting: Cardiology

## 2017-12-25 LAB — CUP PACEART REMOTE DEVICE CHECK
Brady Statistic AP VP Percent: 0 %
Brady Statistic AP VS Percent: 65 %
Brady Statistic AS VP Percent: 0 %
Brady Statistic AS VS Percent: 35 %
Brady Statistic RA Percent Paced: 65 %
Brady Statistic RV Percent Paced: 0 %
Date Time Interrogation Session: 20190314061014
Implantable Lead Implant Date: 20141111
Implantable Lead Location: 753859
Implantable Lead Location: 753860
Implantable Lead Model: 350
Implantable Lead Model: 350
Implantable Lead Serial Number: 29497144
Lead Channel Impedance Value: 485 Ohm
Lead Channel Impedance Value: 632 Ohm
Lead Channel Pacing Threshold Pulse Width: 0.4 ms
MDC IDC LEAD IMPLANT DT: 20141111
MDC IDC LEAD SERIAL: 29446968
MDC IDC MSMT BATTERY REMAINING PERCENTAGE: 70 %
MDC IDC MSMT LEADCHNL RA PACING THRESHOLD AMPLITUDE: 0.6 V
MDC IDC MSMT LEADCHNL RV PACING THRESHOLD AMPLITUDE: 0.8 V
MDC IDC MSMT LEADCHNL RV PACING THRESHOLD PULSEWIDTH: 0.4 ms
MDC IDC PG IMPLANT DT: 20141111
MDC IDC PG SERIAL: 68069787
MDC IDC SET LEADCHNL RV PACING PULSEWIDTH: 0.4 ms

## 2018-01-02 ENCOUNTER — Other Ambulatory Visit: Payer: Self-pay

## 2018-01-02 ENCOUNTER — Ambulatory Visit (INDEPENDENT_AMBULATORY_CARE_PROVIDER_SITE_OTHER): Payer: Managed Care, Other (non HMO) | Admitting: Internal Medicine

## 2018-01-02 ENCOUNTER — Encounter: Payer: Self-pay | Admitting: Internal Medicine

## 2018-01-02 VITALS — BP 109/68 | HR 80 | Ht 69.0 in | Wt 163.0 lb

## 2018-01-02 DIAGNOSIS — R001 Bradycardia, unspecified: Secondary | ICD-10-CM | POA: Diagnosis not present

## 2018-01-02 NOTE — Progress Notes (Signed)
    PCP: Estanislado PandySasser, Paul W, MD   Primary EP:  Dr Sindy GuadeloupeAllred  John [name] is a 52 y.o. male who presents today for routine electrophysiology followup.  Since last being seen in our clinic, the patient reports doing very well.  He enjoys his small grandchildren and his son.  Remains active.  + rare SOB.  Today, he denies symptoms of palpitations, chest pain,  lower extremity edema, dizziness, presyncope, or syncope.  The patient is otherwise without complaint today.   Past Medical History:  Diagnosis Date  . Chronic lower back pain    "q hs when I lay down in bed" (08/24/2013)  . Dyspnea on exertion    "since ~ 05/2013" (08/24/2013)  . Family history of anesthesia complication    "Mom; forgot what happens" (08/24/2013)  . Sinus bradycardia 08/12/2013   Past Surgical History:  Procedure Laterality Date  . BACK SURGERY  1992-94   ruptured disc  . LEFT HEART CATHETERIZATION WITH CORONARY ANGIOGRAM N/A 04/08/2014   Procedure: LEFT HEART CATHETERIZATION WITH CORONARY ANGIOGRAM;  Surgeon: Lesleigh NoeHenry W Smith III, MD;  [facility];  Service: Cardiovascular;  Laterality: N/A;  . LUMBAR DISC SURGERY  1992; 1994   "disc ruptured twice" (08/24/2013)  . PACEMAKER INSERTION  08/24/2013   Biotronik dual chamber PPM implanted by Dr Johney FrameAllred  . PERMANENT PACEMAKER INSERTION N/A 08/24/2013   Procedure: PERMANENT PACEMAKER INSERTION;  Surgeon: Gardiner RhymeJames D Ridley Dileo, MD;  Location: [facility];  Service: Cardiovascular;  Laterality: N/A;  . TONSILLECTOMY AND ADENOIDECTOMY  ~ 1977    ROS- all systems are reviewed and negative except as per HPI above  Current Outpatient Medications  Medication Sig Dispense Refill  . ibuprofen (ADVIL,MOTRIN) 200 MG tablet Take 200 mg every 6 (six) hours as needed by mouth.     No current facility-administered medications for this visit.     Physical Exam: Vitals:   01/02/18 1310  BP: 109/68  Pulse: 80  SpO2: 94%  Weight: 163 lb (73.9 kg)  Height: 5\' 9"  (1.753 m)     GEN- The patient is well appearing, alert and oriented x 3 today.   Head- normocephalic, atraumatic Eyes-  Sclera clear, conjunctiva pink Ears- hearing intact Oropharynx- clear Lungs- Clear to ausculation bilaterally, normal work of breathing Chest- pacemaker pocket is well healed Heart- Regular rate and rhythm, no murmurs, rubs or gallops, PMI not laterally displaced GI- soft, NT, ND, + BS Extremities- no clubbing, cyanosis, or edema  Pacemaker interrogation- reviewed in detail today,  See PACEART report  ekg tracing ordered today is personally reviewed and shows atrial paced rhythm  Assessment and Plan:  1. Symptomatic sinus bradycardia  Normal pacemaker function See Pace Art report No changes today  2. SOB  Improved No further workup planned  Remote monitoring Return in a year  Hillis RangeJames Tylar Merendino MD, [facility] 01/02/2018 1:28 PM

## 2018-01-02 NOTE — Patient Instructions (Signed)
Medication Instructions:  Continue all current medications.  Labwork: none  Testing/Procedures: none  Follow-Up: Your physician wants you to follow up in:  1 year.  You will receive a reminder letter in the mail one-two months in advance.  If you don't receive a letter, please call our office to schedule the follow up appointment.  - Dr. Allred.   Any Other Special Instructions Will Be Listed Below (If Applicable). Remote monitoring is used to monitor your Pacemaker of ICD from home. This monitoring reduces the number of office visits required to check your device to one time per year. It allows us to keep an eye on the functioning of your device to ensure it is working properly. You are scheduled for a device check from home on 04/06/2018. You may send your transmission at any time that day. If you have a wireless device, the transmission will be sent automatically. After your physician reviews your transmission, you will receive a postcard with your next transmission date.  If you need a refill on your cardiac medications before your next appointment, please call your pharmacy.  

## 2018-01-06 LAB — CUP PACEART INCLINIC DEVICE CHECK
Date Time Interrogation Session: 20190322162300
Implantable Lead Implant Date: 20141111
Implantable Lead Location: 753860
Implantable Lead Model: 350
Implantable Lead Model: 350
Implantable Lead Serial Number: 29446968
Lead Channel Impedance Value: 604 Ohm
Lead Channel Pacing Threshold Amplitude: 0.6 V
Lead Channel Pacing Threshold Pulse Width: 0.4 ms
Lead Channel Pacing Threshold Pulse Width: 0.4 ms
Lead Channel Pacing Threshold Pulse Width: 0.4 ms
Lead Channel Sensing Intrinsic Amplitude: 12.2 mV
Lead Channel Sensing Intrinsic Amplitude: 3.2 mV
Lead Channel Setting Pacing Amplitude: 1.6 V
Lead Channel Setting Pacing Pulse Width: 0.4 ms
MDC IDC LEAD IMPLANT DT: 20141111
MDC IDC LEAD LOCATION: 753859
MDC IDC LEAD SERIAL: 29497144
MDC IDC MSMT LEADCHNL RA IMPEDANCE VALUE: 526 Ohm
MDC IDC MSMT LEADCHNL RA PACING THRESHOLD AMPLITUDE: 0.6 V
MDC IDC MSMT LEADCHNL RA SENSING INTR AMPL: 3.2 mV
MDC IDC MSMT LEADCHNL RV PACING THRESHOLD AMPLITUDE: 0.8 V
MDC IDC MSMT LEADCHNL RV PACING THRESHOLD AMPLITUDE: 0.8 V
MDC IDC MSMT LEADCHNL RV PACING THRESHOLD PULSEWIDTH: 0.4 ms
MDC IDC PG IMPLANT DT: 20141111
MDC IDC SET LEADCHNL RV PACING AMPLITUDE: 1.8 V
Pulse Gen Serial Number: 68069787

## 2018-03-23 ENCOUNTER — Ambulatory Visit (INDEPENDENT_AMBULATORY_CARE_PROVIDER_SITE_OTHER): Payer: Managed Care, Other (non HMO) | Admitting: *Deleted

## 2018-03-23 DIAGNOSIS — R001 Bradycardia, unspecified: Secondary | ICD-10-CM | POA: Diagnosis not present

## 2018-03-23 NOTE — Progress Notes (Signed)
Remote pacemaker transmission.   

## 2018-03-24 ENCOUNTER — Encounter: Payer: Self-pay | Admitting: Cardiology

## 2018-04-14 LAB — CUP PACEART REMOTE DEVICE CHECK
Brady Statistic AP VS Percent: 65 %
Brady Statistic AS VP Percent: 0 %
Brady Statistic RA Percent Paced: 66 %
Brady Statistic RV Percent Paced: 0 %
Date Time Interrogation Session: 20190702060820
Implantable Lead Model: 350
Implantable Lead Serial Number: 29497144
Lead Channel Impedance Value: 491 Ohm
Lead Channel Pacing Threshold Pulse Width: 0.4 ms
MDC IDC LEAD IMPLANT DT: 20141111
MDC IDC LEAD IMPLANT DT: 20141111
MDC IDC LEAD LOCATION: 753859
MDC IDC LEAD LOCATION: 753860
MDC IDC LEAD SERIAL: 29446968
MDC IDC MSMT BATTERY REMAINING PERCENTAGE: 65 %
MDC IDC MSMT LEADCHNL RA PACING THRESHOLD AMPLITUDE: 0.6 V
MDC IDC MSMT LEADCHNL RV IMPEDANCE VALUE: 606 Ohm
MDC IDC MSMT LEADCHNL RV PACING THRESHOLD AMPLITUDE: 0.8 V
MDC IDC MSMT LEADCHNL RV PACING THRESHOLD PULSEWIDTH: 0.4 ms
MDC IDC PG IMPLANT DT: 20141111
MDC IDC PG SERIAL: 68069787
MDC IDC SET LEADCHNL RV PACING PULSEWIDTH: 0.4 ms
MDC IDC STAT BRADY AP VP PERCENT: 0 %
MDC IDC STAT BRADY AS VS PERCENT: 34 %

## 2018-06-22 ENCOUNTER — Ambulatory Visit (INDEPENDENT_AMBULATORY_CARE_PROVIDER_SITE_OTHER): Payer: Managed Care, Other (non HMO) | Admitting: *Deleted

## 2018-06-22 ENCOUNTER — Encounter: Payer: Self-pay | Admitting: Cardiology

## 2018-06-22 DIAGNOSIS — R001 Bradycardia, unspecified: Secondary | ICD-10-CM | POA: Diagnosis not present

## 2018-06-22 NOTE — Progress Notes (Signed)
Remote pacemaker transmission.   

## 2018-07-15 LAB — CUP PACEART REMOTE DEVICE CHECK
Brady Statistic AP VP Percent: 0 %
Brady Statistic AP VS Percent: 77 %
Brady Statistic AS VP Percent: 0 %
Brady Statistic AS VS Percent: 21 %
Brady Statistic RA Percent Paced: 79 %
Date Time Interrogation Session: 20191002054757
Implantable Lead Implant Date: 20141111
Implantable Lead Location: 753860
Implantable Lead Model: 350
Implantable Lead Model: 350
Implantable Lead Serial Number: 29446968
Implantable Lead Serial Number: 29497144
Lead Channel Impedance Value: 594 Ohm
Lead Channel Pacing Threshold Amplitude: 0.6 V
Lead Channel Pacing Threshold Pulse Width: 0.4 ms
Lead Channel Pacing Threshold Pulse Width: 0.4 ms
MDC IDC LEAD IMPLANT DT: 20141111
MDC IDC LEAD LOCATION: 753859
MDC IDC MSMT BATTERY REMAINING PERCENTAGE: 65 %
MDC IDC MSMT LEADCHNL RA IMPEDANCE VALUE: 489 Ohm
MDC IDC MSMT LEADCHNL RV PACING THRESHOLD AMPLITUDE: 0.8 V
MDC IDC PG IMPLANT DT: 20141111
MDC IDC SET LEADCHNL RV PACING PULSEWIDTH: 0.4 ms
MDC IDC STAT BRADY RV PERCENT PACED: 0 %
Pulse Gen Serial Number: 68069787

## 2018-09-21 ENCOUNTER — Ambulatory Visit (INDEPENDENT_AMBULATORY_CARE_PROVIDER_SITE_OTHER): Payer: Managed Care, Other (non HMO)

## 2018-09-21 DIAGNOSIS — R001 Bradycardia, unspecified: Secondary | ICD-10-CM | POA: Diagnosis not present

## 2018-09-22 NOTE — Progress Notes (Signed)
Remote pacemaker transmission.   

## 2018-11-07 LAB — CUP PACEART REMOTE DEVICE CHECK
Implantable Lead Implant Date: 20141111
Implantable Lead Location: 753859
Implantable Lead Location: 753860
Implantable Lead Model: 350
Implantable Lead Model: 350
Implantable Lead Serial Number: 29446968
Implantable Lead Serial Number: 29497144
Implantable Pulse Generator Implant Date: 20141111
MDC IDC LEAD IMPLANT DT: 20141111
MDC IDC SESS DTM: 20200125075351
Pulse Gen Serial Number: 68069787

## 2018-12-21 ENCOUNTER — Encounter: Payer: Managed Care, Other (non HMO) | Admitting: *Deleted

## 2019-01-01 ENCOUNTER — Encounter: Payer: Self-pay | Admitting: Cardiology

## 2019-02-11 ENCOUNTER — Telehealth: Payer: Self-pay

## 2019-02-11 NOTE — Telephone Encounter (Signed)
Spoke with pt wife regarding appt on 02/12/19. Pt wife stated pt will check vitals prior to appt. Pts wife questions and concerns were address.

## 2019-02-12 ENCOUNTER — Ambulatory Visit (INDEPENDENT_AMBULATORY_CARE_PROVIDER_SITE_OTHER): Payer: Managed Care, Other (non HMO) | Admitting: *Deleted

## 2019-02-12 ENCOUNTER — Telehealth (INDEPENDENT_AMBULATORY_CARE_PROVIDER_SITE_OTHER): Payer: Managed Care, Other (non HMO) | Admitting: Internal Medicine

## 2019-02-12 ENCOUNTER — Other Ambulatory Visit: Payer: Self-pay

## 2019-02-12 VITALS — BP 113/69 | HR 67 | Wt 166.0 lb

## 2019-02-12 DIAGNOSIS — R001 Bradycardia, unspecified: Secondary | ICD-10-CM

## 2019-02-12 LAB — CUP PACEART REMOTE DEVICE CHECK
Date Time Interrogation Session: 20200501150944
Implantable Lead Implant Date: 20141111
Implantable Lead Implant Date: 20141111
Implantable Lead Location: 753859
Implantable Lead Location: 753860
Implantable Lead Model: 350
Implantable Lead Model: 350
Implantable Lead Serial Number: 29446968
Implantable Lead Serial Number: 29497144
Implantable Pulse Generator Implant Date: 20141111
Pulse Gen Serial Number: 68069787

## 2019-02-12 NOTE — Progress Notes (Signed)
Electrophysiology TeleHealth Note   Due to national recommendations of social distancing due to COVID 19, an audio/video telehealth visit is felt to be most appropriate for this patient at this time.  See MyChart message from today for the patient's consent to telehealth for John [name].   Date:  02/12/2019   ID:  John [name], DOB 02/13/1966, MRN [id number]  Location: patient's home  Provider location: [address]  Evaluation Performed: Follow-up visit  PCP:  Estanislado PandySasser, Paul W, MD  Electrophysiologist:  Dr Johney FrameAllred  Chief Complaint:  Pacemaker follow-up  History of Present Illness:    John [name] is a 53 y.o. male who presents via audio/video conferencing for a telehealth visit today.  Since last being seen in our clinic, the patient reports doing very well.  Today, he denies symptoms of palpitations, chest pain, shortness of breath,  lower extremity edema, dizziness, presyncope, or syncope.  The patient is otherwise without complaint today.  The patient denies symptoms of fevers, chills, cough, or new SOB worrisome for COVID 19.  Past Medical History:  Diagnosis Date  . Chronic lower back pain    "q hs when I lay down in bed" (08/24/2013)  . Dyspnea on exertion    "since ~ 05/2013" (08/24/2013)  . Family history of anesthesia complication    "Mom; forgot what happens" (08/24/2013)  . Sinus bradycardia 08/12/2013    Past Surgical History:  Procedure Laterality Date  . BACK SURGERY  1992-94   ruptured disc  . LEFT HEART CATHETERIZATION WITH CORONARY ANGIOGRAM N/A 04/08/2014   Procedure: LEFT HEART CATHETERIZATION WITH CORONARY ANGIOGRAM;  Surgeon: Lesleigh NoeHenry W Smith III, MD;  [facility];  Service: Cardiovascular;  Laterality: N/A;  . LUMBAR DISC SURGERY  1992; 1994   "disc ruptured twice" (08/24/2013)  . PACEMAKER INSERTION  08/24/2013   Biotronik dual chamber PPM implanted by Dr Johney FrameAllred  . PERMANENT PACEMAKER INSERTION N/A 08/24/2013   Procedure: PERMANENT  PACEMAKER INSERTION;  Surgeon: Gardiner RhymeJames D Kaeli Nichelson, MD;  Location: [facility];  Service: Cardiovascular;  Laterality: N/A;  . TONSILLECTOMY AND ADENOIDECTOMY  ~ 1977    Current Outpatient Medications  Medication Sig Dispense Refill  . ibuprofen (ADVIL,MOTRIN) 200 MG tablet Take 200 mg every 6 (six) hours as needed by mouth.     No current facility-administered medications for this visit.     Allergies:   Penicillins; Codeine; and Morphine and related   Social History:  The patient  reports that he has never smoked. He has never used smokeless tobacco. He reports current alcohol use. He reports that he does not use drugs.   Family History:  The patient's  family history includes Diabetes in his father and mother.   ROS:  Please see the history of present illness.   All other systems are personally reviewed and negative.    Exam:    Vital Signs:  BP 113/69   Pulse 67   Wt 166 lb (75.3 kg)   BMI 24.51 kg/m   Well appearing, alert and conversant, regular work of breathing,  good skin color Eyes- anicteric, neuro- grossly intact, skin- no apparent rash or lesions or cyanosis, mouth- oral mucosa is pink   Labs/Other Tests and Data Reviewed:    Recent Labs: No results found for requested labs within last 8760 hours.   Wt Readings from Last 3 Encounters:  02/12/19 166 lb (75.3 kg)  01/02/18 163 lb (73.9 kg)  12/20/16 159 lb 6.4 oz (72.3 kg)  Other studies personally reviewed: Additional studies/ records that were reviewed today include: my prior notes     Last device remote is reviewed from PaceART PDF dated 09/21/2018 which reveals normal device function, no arrhythmias    ASSESSMENT & PLAN:    1.  Sinus bradycardia Remotes are overdue I have sent a staff message to Britta Mccreedy to schedule Last remote reveals normal device function and 60% battery longevity He is not depending  2. COVID 19 screen The patient denies symptoms of COVID 19 at this time.  The importance of  social distancing was discussed today.  Follow-up:  12 months with me Next remote: now  Current medicines are reviewed at length with the patient today.   The patient does not have concerns regarding his medicines.  The following changes were made today:  none  Labs/ tests ordered today include:  No orders of the defined types were placed in this encounter.  Patient Risk:  after full review of this patients clinical status, I feel that they are at moderate risk at this time.  Today, I have spent 15 minutes with the patient with telehealth technology discussing pacemaker follow-up .    Randolm Idol, MD  02/12/2019 12:04 PM     [name] [address] [place] [address] [phone] (office) [phone] (fax)

## 2019-02-18 ENCOUNTER — Encounter: Payer: Self-pay | Admitting: Cardiology

## 2019-02-18 NOTE — Progress Notes (Signed)
Remote pacemaker transmission.   

## 2019-03-31 ENCOUNTER — Telehealth: Payer: Self-pay | Admitting: Internal Medicine

## 2019-03-31 NOTE — Telephone Encounter (Signed)
New Message      [facility] Pre-operative Risk Assessment    Request for surgical clearance:  1. What type of surgery is being performed? Liphotripsy  2. When is this surgery scheduled? 04/08/2019    3. What type of clearance is required (medical clearance vs. Pharmacy clearance to hold med vs. Both)? Both   4. Are there any medications that need to be held prior to surgery and how long? Blood thinners and Aspirin   5. Practice name and name of physician performing surgery? [facility] Dr. Jeffie Pollock   6. What is your office phone number [phone]    7.   What is your office fax number [phone]  8.   Anesthesia type (None, local, MAC, general) ? MAC   John [name] 03/31/2019, 4:49 PM  _________________________________________________________________   (provider comments below)

## 2019-04-01 ENCOUNTER — Other Ambulatory Visit: Payer: Self-pay | Admitting: Urology

## 2019-04-01 NOTE — Telephone Encounter (Signed)
   Primary Cardiologist: No primary care provider on file.  Chart reviewed as part of pre-operative protocol coverage. Patient was contacted 04/01/2019 in reference to pre-operative risk assessment for pending surgery as outlined below.  John [name] was last seen on 02/12/2019 by Dr. Thompson Grayer. Since that day, John [name] has been feeling at his baseline. He denies chest pain, palpitations, SOB, PND or orthopnea symptoms. He is only taking IBprofen as needed for HA or other pain. Not currently on any other blood thinners.   Therefore, based on ACC/AHA guidelines, the patient would be at acceptable risk for the planned procedure without further cardiovascular testing.   I will route this recommendation to the requesting party via Epic fax function and remove from pre-op pool.  Please call with questions.  Kathyrn Drown, NP 04/01/2019, 10:02 AM

## 2019-04-02 ENCOUNTER — Encounter (HOSPITAL_COMMUNITY): Payer: Self-pay | Admitting: *Deleted

## 2019-04-05 ENCOUNTER — Other Ambulatory Visit (HOSPITAL_COMMUNITY)
Admission: RE | Admit: 2019-04-05 | Discharge: 2019-04-05 | Disposition: A | Discharge status: Home / Self Care | Payer: Managed Care, Other (non HMO) | Source: Ambulatory Visit | Attending: Urology | Admitting: Urology

## 2019-04-05 ENCOUNTER — Encounter (HOSPITAL_COMMUNITY)
Admission: RE | Admit: 2019-04-05 | Discharge: 2019-04-05 | Disposition: A | Discharge status: Home / Self Care | Payer: Managed Care, Other (non HMO) | Source: Ambulatory Visit | Attending: Urology | Admitting: Urology

## 2019-04-05 ENCOUNTER — Other Ambulatory Visit: Payer: Self-pay

## 2019-04-05 DIAGNOSIS — R001 Bradycardia, unspecified: Secondary | ICD-10-CM | POA: Diagnosis not present

## 2019-04-05 DIAGNOSIS — Z880 Allergy status to penicillin: Secondary | ICD-10-CM | POA: Diagnosis not present

## 2019-04-05 DIAGNOSIS — Z950 Presence of cardiac pacemaker: Secondary | ICD-10-CM | POA: Diagnosis not present

## 2019-04-05 DIAGNOSIS — N202 Calculus of kidney with calculus of ureter: Secondary | ICD-10-CM | POA: Diagnosis present

## 2019-04-05 DIAGNOSIS — Z1159 Encounter for screening for other viral diseases: Secondary | ICD-10-CM | POA: Insufficient documentation

## 2019-04-05 DIAGNOSIS — N201 Calculus of ureter: Secondary | ICD-10-CM | POA: Diagnosis not present

## 2019-04-05 DIAGNOSIS — Z01818 Encounter for other preprocedural examination: Secondary | ICD-10-CM | POA: Insufficient documentation

## 2019-04-05 LAB — SARS CORONAVIRUS 2 (TAT 6-24 HRS): SARS Coronavirus 2: NEGATIVE

## 2019-04-07 NOTE — H&P (Signed)
CC: I have ureteral stone.  HPI: John [name] is a 53 year-old male patient who was referred by Dr. Consuello Masse, MD who is here for ureteral stone.  The problem is on the right side. He is currently having flank pain. He denies having back pain, groin pain, nausea, vomiting, fever, and chills. Pain is occuring on the right side. He has not caught a stone in his urine strainer since his symptoms began.   He has had ureteral stent for treatment of his stones in the past.   [name] is a 52 yo WM who is sent in consultation by Dr. Consuello Masse for a 76m right proximal [facility]. He has had a prior stone over 10 years ago. he continues to have pain intermittently but it is not as severe. He has nausea. he had some frequency and urgency Monday morning.      ALLERGIES: Penicillin    MEDICATIONS: Tamsulosin Hcl 0.4 mg capsule 1 capsule PO Daily     GU PSH: Cystoscopy Insert Stent    NON-GU PSH: Back surgery Pacemaker placement    GU PMH: None   NON-GU PMH: Bradycardia, unspecified    FAMILY HISTORY: Kidney Stones - Runs in Family Prostate Cancer - Runs in Family   SOCIAL HISTORY: Marital Status: Married Preferred Language: English; Race: White Current Smoking Status: Patient has never smoked.   Tobacco Use Assessment Completed: Used Tobacco in last 30 days?     Notes: 1 son, 1 daughter    REVIEW OF SYSTEMS:    GU Review Male:   Patient reports frequent urination, hard to postpone urination, get up at night to urinate, leakage of urine, and stream starts and stops. Patient denies burning/ pain with urination, trouble starting your stream, have to strain to urinate , erection problems, and penile pain.  Gastrointestinal (Upper):   Patient reports nausea and vomiting. Patient denies indigestion/ heartburn.  Gastrointestinal (Lower):   Patient denies diarrhea and constipation.  Constitutional:    Patient denies fever, night sweats, weight loss, and fatigue.  Skin:   Patient denies skin rash/ lesion and itching.  Eyes:   Patient denies blurred vision and double vision.  Ears/ Nose/ Throat:   Patient denies sore throat and sinus problems.  Hematologic/Lymphatic:   Patient denies swollen glands and easy bruising.  Cardiovascular:   Patient denies leg swelling and chest pains.  Respiratory:   Patient denies cough and shortness of breath.  Endocrine:   Patient denies excessive thirst.  Musculoskeletal:   Patient denies back pain and joint pain.  Neurological:   Patient denies headaches and dizziness.  Psychologic:   Patient denies depression and anxiety.   VITAL SIGNS:      03/31/2019 03:00 PM  Weight 168 lb / 76.2 kg  Height 69 in / 175.26 cm  BP 129/79 mmHg  Pulse 88 /min  Temperature 98.1 F / 36.7 C  BMI 24.8 kg/m   MULTI-SYSTEM PHYSICAL EXAMINATION:    Constitutional: Well-nourished. No physical deformities. Normally developed. Good grooming.  Neck: Neck symmetrical, not swollen. Normal tracheal position.  Respiratory: Normal breath sounds. No labored breathing, no use of accessory muscles.   Cardiovascular: Regular rate and rhythm. No murmur, no gallop.   Lymphatic: No enlargement, no tenderness of supraclavicular, neck lymph nodes.  Skin: No paleness, no jaundice, no cyanosis. No lesion, no ulcer, no rash.  Neurologic / Psychiatric: Oriented to time, oriented to  place, oriented to person. No depression, no anxiety, no agitation.  Gastrointestinal: Abdominal tenderness in RLQ and flank, No mass, no rigidity, non obese abdomen.   Musculoskeletal: Normal gait and station of head and neck.     PAST DATA REVIEWED:  Source Of History:  Patient  Records Review:   Previous Doctor Records  Urine Test Review:   Urinalysis  X-Ray Review: KUB: Reviewed Films. Discussed With Patient.  C.T. Stone Protocol: Reviewed Films. Reviewed Report. Discussed With Patient.    Notes:                      Records from Dr. Nadara Mustard reviewed.    PROCEDURES:         KUB - [id number]  A single view of the abdomen is obtained. There is a 4-53m stone adjacent to L4 on the right. No other stones are seen. No significant bone, gas or soft tissue abnormalities are noted.       Patient confirmed No Neulasta OnPro Device.            Urinalysis w/Scope Dipstick Dipstick Cont'd Micro  Color: Yellow Bilirubin: Neg mg/dL WBC/hpf: 0 - 5/hpf  Appearance: Cloudy Ketones: Neg mg/dL RBC/hpf: NS (Not Seen)  Specific Gravity: 1.015 Blood: Neg ery/uL Bacteria: Many (>50/hpf)  pH: 7.5 Protein: 1+ mg/dL Cystals: Amorph Phosphates  Glucose: Neg mg/dL Urobilinogen: 0.2 mg/dL Casts: NS (Not Seen)    Nitrites: Neg Trichomonas: Not Present    Leukocyte Esterase: Neg leu/uL Mucous: Not Present      Epithelial Cells: NS (Not Seen)      Yeast: NS (Not Seen)      Sperm: Not Present    ASSESSMENT:      ICD-10 Details  1 GU:   Ureteral calculus - N20.1 His stone hasn't moved. I discussed MET, URS and ESWL and he is interested in ESWL. I will get him cleared by Dr. ARayann Hemanfor his pacemaker and sent a script for hydromorphone which may cause less nausea. I reviewed the risks of ESWL including bleeding, infection, injury to the kidney or adjacent structures, failure to fragment the stone, need for ancillary procedures, thrombotic events, cardiac arrhythmias and sedation complications.    PLAN:            Medications New Meds: Hydromorphone Hcl 2 mg tablet 1 tablet PO Q 6 H   #12  0 Refill(s)            Orders X-Rays: KUB          Schedule Return Visit/Planned Activity: ASAP - Schedule Surgery             Note: ESWL

## 2019-04-08 ENCOUNTER — Ambulatory Visit (HOSPITAL_COMMUNITY): Payer: Managed Care, Other (non HMO) | Admitting: Certified Registered Nurse Anesthetist

## 2019-04-08 ENCOUNTER — Encounter (HOSPITAL_COMMUNITY)
Admission: RE | Disposition: A | Discharge status: Home / Self Care | Payer: Self-pay | Source: Other Acute Inpatient Hospital | Attending: Urology

## 2019-04-08 ENCOUNTER — Ambulatory Visit (HOSPITAL_COMMUNITY): Payer: Managed Care, Other (non HMO)

## 2019-04-08 ENCOUNTER — Encounter (HOSPITAL_COMMUNITY): Payer: Self-pay | Admitting: General Practice

## 2019-04-08 ENCOUNTER — Ambulatory Visit (HOSPITAL_COMMUNITY)
Admission: RE | Admit: 2019-04-08 | Discharge: 2019-04-08 | Disposition: A | Discharge status: Home / Self Care | Payer: Managed Care, Other (non HMO) | Source: Other Acute Inpatient Hospital | Attending: Urology | Admitting: Urology

## 2019-04-08 DIAGNOSIS — N201 Calculus of ureter: Secondary | ICD-10-CM | POA: Insufficient documentation

## 2019-04-08 DIAGNOSIS — Z880 Allergy status to penicillin: Secondary | ICD-10-CM | POA: Insufficient documentation

## 2019-04-08 DIAGNOSIS — R001 Bradycardia, unspecified: Secondary | ICD-10-CM | POA: Insufficient documentation

## 2019-04-08 DIAGNOSIS — Z950 Presence of cardiac pacemaker: Secondary | ICD-10-CM | POA: Insufficient documentation

## 2019-04-08 DIAGNOSIS — Z1159 Encounter for screening for other viral diseases: Secondary | ICD-10-CM | POA: Insufficient documentation

## 2019-04-08 HISTORY — PX: EXTRACORPOREAL SHOCK WAVE LITHOTRIPSY: SHX1557

## 2019-04-08 SURGERY — LITHOTRIPSY, ESWL
Anesthesia: Monitor Anesthesia Care | Laterality: Right

## 2019-04-08 MED ORDER — PROPOFOL 10 MG/ML IV BOLUS
INTRAVENOUS | Status: AC
  Filled 2019-04-08: qty 40

## 2019-04-08 MED ORDER — SODIUM CHLORIDE 0.9 % IV SOLN: INTRAVENOUS | Status: DC

## 2019-04-08 MED ORDER — HYDROMORPHONE HCL 2 MG PO TABS: 2.0000 mg | ORAL_TABLET | Freq: Four times a day (QID) | ORAL | 0 refills | Status: AC | PRN

## 2019-04-08 MED ORDER — FENTANYL CITRATE (PF) 100 MCG/2ML IJ SOLN
INTRAMUSCULAR | Status: DC | PRN
  Administered 2019-04-08 (×2): 25 ug via INTRAVENOUS
  Administered 2019-04-08: 50 ug via INTRAVENOUS

## 2019-04-08 MED ORDER — MIDAZOLAM HCL 2 MG/2ML IJ SOLN
INTRAMUSCULAR | Status: AC
  Filled 2019-04-08: qty 2

## 2019-04-08 MED ORDER — PHENYLEPHRINE 40 MCG/ML (10ML) SYRINGE FOR IV PUSH (FOR BLOOD PRESSURE SUPPORT)
PREFILLED_SYRINGE | INTRAVENOUS | Status: AC
  Filled 2019-04-08: qty 10

## 2019-04-08 MED ORDER — ONDANSETRON HCL 4 MG/2ML IJ SOLN
INTRAMUSCULAR | Status: AC
  Filled 2019-04-08: qty 2

## 2019-04-08 MED ORDER — FENTANYL CITRATE (PF) 100 MCG/2ML IJ SOLN
INTRAMUSCULAR | Status: AC
  Filled 2019-04-08: qty 2

## 2019-04-08 MED ORDER — EPHEDRINE 5 MG/ML INJ
INTRAVENOUS | Status: AC
  Filled 2019-04-08: qty 10

## 2019-04-08 MED ORDER — DIAZEPAM 5 MG PO TABS: 10.0000 mg | ORAL_TABLET | ORAL | Status: DC

## 2019-04-08 MED ORDER — LACTATED RINGERS IV SOLN
INTRAVENOUS | Status: DC
  Administered 2019-04-08: 09:00:00 via INTRAVENOUS

## 2019-04-08 MED ORDER — SODIUM CHLORIDE 0.9% FLUSH: 3.0000 mL | Freq: Two times a day (BID) | INTRAVENOUS | Status: DC

## 2019-04-08 MED ORDER — PROPOFOL 500 MG/50ML IV EMUL
INTRAVENOUS | Status: DC | PRN
  Administered 2019-04-08: 75 ug/kg/min via INTRAVENOUS

## 2019-04-08 MED ORDER — DIPHENHYDRAMINE HCL 25 MG PO CAPS: 25.0000 mg | ORAL_CAPSULE | ORAL | Status: DC

## 2019-04-08 MED ORDER — MIDAZOLAM HCL 5 MG/5ML IJ SOLN
INTRAMUSCULAR | Status: DC | PRN
  Administered 2019-04-08: 2 mg via INTRAVENOUS

## 2019-04-08 MED ORDER — GLYCOPYRROLATE PF 0.2 MG/ML IJ SOSY
PREFILLED_SYRINGE | INTRAMUSCULAR | Status: AC
  Filled 2019-04-08: qty 1

## 2019-04-08 MED ORDER — SUCCINYLCHOLINE CHLORIDE 200 MG/10ML IV SOSY
PREFILLED_SYRINGE | INTRAVENOUS | Status: AC
  Filled 2019-04-08: qty 10

## 2019-04-08 MED ORDER — CIPROFLOXACIN HCL 500 MG PO TABS
500.0000 mg | ORAL_TABLET | ORAL | Status: AC
  Administered 2019-04-08: 500 mg via ORAL
  Filled 2019-04-08: qty 1

## 2019-04-08 NOTE — Interval H&P Note (Signed)
History and Physical Interval Note:  The stone is no longer adjacent to L4 and could be just lateral to the right SI joint or at the UVJ.  Fluoroscopy will be utilized to locate the stone.   04/08/2019 10:06 AM  John [name]  has presented today for surgery, with the diagnosis of right ureteral stone.  The various methods of treatment have been discussed with the patient and family. After consideration of risks, benefits and other options for treatment, the patient has consented to  Procedure(s): EXTRACORPOREAL SHOCK WAVE LITHOTRIPSY (ESWL) (Right) as a surgical intervention.  The patient's history has been reviewed, patient examined, no change in status, stable for surgery.  I have reviewed the patient's chart and labs.  Questions were answered to the patient's satisfaction.     Irine Seal

## 2019-04-08 NOTE — Transfer of Care (Signed)
Immediate Anesthesia Transfer of Care Note  Patient: John [name]  Procedure(s) Performed: EXTRACORPOREAL SHOCK WAVE LITHOTRIPSY (ESWL) (Right )  Patient Location: PACU  Anesthesia Type:MAC  Level of Consciousness: awake, alert  and oriented  Airway & Oxygen Therapy: Patient Spontanous Breathing  Post-op Assessment: Report given to RN and Post -op Vital signs reviewed and stable  Post vital signs: Reviewed and stable  Last Vitals:  Vitals Value Taken Time  BP    Temp    Pulse    Resp    SpO2      Last Pain:  Vitals:   04/08/19 0756  TempSrc: Oral  PainSc: 7          Complications: No apparent anesthesia complications

## 2019-04-08 NOTE — Anesthesia Preprocedure Evaluation (Addendum)
Anesthesia Evaluation  Patient identified by MRN, date of birth, ID band Patient awake    Reviewed: Allergy & Precautions, NPO status , Patient's Chart, lab work & pertinent test results  History of Anesthesia Complications (+) Family history of anesthesia reaction  Airway        Dental   Pulmonary neg pulmonary ROS,           Cardiovascular + pacemaker   DDIR pacer for bradycardia 27% AS-VS, 73% AP-VS   Neuro/Psych negative neurological ROS  negative psych ROS   GI/Hepatic negative GI ROS, Neg liver ROS,   Endo/Other  negative endocrine ROS  Renal/GU negative Renal ROS     Musculoskeletal negative musculoskeletal ROS (+)   Abdominal   Peds  Hematology negative hematology ROS (+)   Anesthesia Other Findings   Reproductive/Obstetrics                            Anesthesia Physical Anesthesia Plan  ASA: II  Anesthesia Plan: MAC   Post-op Pain Management:    Induction: Intravenous  PONV Risk Score and Plan: 2 and Ondansetron, Midazolam, TIVA and Treatment may vary due to age or medical condition  Airway Management Planned: Natural Airway  Additional Equipment: None  Intra-op Plan:   Post-operative Plan:   Informed Consent: I have reviewed the patients History and Physical, chart, labs and discussed the procedure including the risks, benefits and alternatives for the proposed anesthesia with the patient or authorized representative who has indicated his/her understanding and acceptance.     Dental advisory given  Plan Discussed with: CRNA  Anesthesia Plan Comments:         Anesthesia Quick Evaluation

## 2019-04-08 NOTE — Discharge Instructions (Signed)

## 2019-04-09 ENCOUNTER — Encounter (HOSPITAL_COMMUNITY): Payer: Self-pay | Admitting: Urology

## 2019-04-09 ENCOUNTER — Encounter: Payer: Managed Care, Other (non HMO) | Admitting: *Deleted

## 2019-04-15 ENCOUNTER — Encounter: Payer: Self-pay | Admitting: Cardiology

## 2019-05-14 ENCOUNTER — Ambulatory Visit (INDEPENDENT_AMBULATORY_CARE_PROVIDER_SITE_OTHER): Payer: Managed Care, Other (non HMO) | Admitting: *Deleted

## 2019-05-14 DIAGNOSIS — R001 Bradycardia, unspecified: Secondary | ICD-10-CM

## 2019-05-14 LAB — CUP PACEART REMOTE DEVICE CHECK
Date Time Interrogation Session: 20200731100001
Implantable Lead Implant Date: 20141111
Implantable Lead Implant Date: 20141111
Implantable Lead Location: 753859
Implantable Lead Location: 753860
Implantable Lead Model: 350
Implantable Lead Model: 350
Implantable Lead Serial Number: 29446968
Implantable Lead Serial Number: 29497144
Implantable Pulse Generator Implant Date: 20141111
Pulse Gen Serial Number: 68069787

## 2019-05-20 ENCOUNTER — Encounter: Payer: Self-pay | Admitting: Cardiology

## 2019-05-20 NOTE — Progress Notes (Signed)
Remote pacemaker transmission.   

## 2019-08-12 ENCOUNTER — Telehealth: Payer: Self-pay | Admitting: Internal Medicine

## 2019-08-12 NOTE — Telephone Encounter (Signed)
Home Monitoring nightly transmission reviewed. AP 73%, VP 0%. Lead trends stable. Atrial arrhythmia burden 0%. Presenting rhythm as of 08/04/19 shows AP/VS @ 60bpm. No HVRs.  Spoke with patient. Reassured him that PPM transmission shows normal device function. Not on any cardiac meds. Pt reports when he is doing his typical job, he can go at his own pace and he feels fine. 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 Also happens sometimes when he is doing yard work, worse over the past 6 months. No weight gain or edema. Denies chest pain, palpitations, syncope, or other cardiac symptoms. Pt reports he is concerned that it is related to his heart, requesting recommendations from Dr. Rayann Heman.  Pt has access to a smartphone and is agreeable to a virtual visit if necessary. Forwarded to Dr. Rayann Heman and Sonia Baller, RN, for advisement.

## 2019-08-12 NOTE — Telephone Encounter (Signed)
Patient called wanting to speak with Dr. Jackalyn Lombard nurse. 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 He states that he becomes very fatigue with shortness of breath. Patient is concerned about his pacemaker.  [phone].

## 2019-08-13 ENCOUNTER — Ambulatory Visit (INDEPENDENT_AMBULATORY_CARE_PROVIDER_SITE_OTHER): Payer: Managed Care, Other (non HMO) | Admitting: *Deleted

## 2019-08-13 DIAGNOSIS — R001 Bradycardia, unspecified: Secondary | ICD-10-CM | POA: Diagnosis not present

## 2019-08-13 DIAGNOSIS — I495 Sick sinus syndrome: Secondary | ICD-10-CM

## 2019-08-13 LAB — CUP PACEART REMOTE DEVICE CHECK
Date Time Interrogation Session: 20201030131331
Implantable Lead Implant Date: 20141111
Implantable Lead Implant Date: 20141111
Implantable Lead Location: 753859
Implantable Lead Location: 753860
Implantable Lead Model: 350
Implantable Lead Model: 350
Implantable Lead Serial Number: 29446968
Implantable Lead Serial Number: 29497144
Implantable Pulse Generator Implant Date: 20141111
Pulse Gen Serial Number: 68069787

## 2019-08-19 NOTE — Telephone Encounter (Signed)
Sending to [place] to set up virtual visit.  Pt has not had ECHO since 2014.

## 2019-08-25 NOTE — Progress Notes (Signed)
Remote pacemaker transmission.   

## 2019-08-30 ENCOUNTER — Telehealth (INDEPENDENT_AMBULATORY_CARE_PROVIDER_SITE_OTHER): Payer: Managed Care, Other (non HMO) | Admitting: Internal Medicine

## 2019-08-30 ENCOUNTER — Other Ambulatory Visit: Payer: Self-pay

## 2019-08-30 ENCOUNTER — Encounter: Payer: Self-pay | Admitting: Internal Medicine

## 2019-08-30 VITALS — Ht 69.0 in | Wt 170.0 lb

## 2019-08-30 DIAGNOSIS — R001 Bradycardia, unspecified: Secondary | ICD-10-CM | POA: Diagnosis not present

## 2019-08-30 NOTE — Progress Notes (Signed)
Electrophysiology TeleHealth Note  Due to national recommendations of social distancing due to [place] 19, an audio telehealth visit is felt to be most appropriate for this patient at this time.  Verbal consent was obtained by me for the telehealth visit today.  The patient does not have capability for a virtual visit.  A phone visit is therefore required today.   Date:  08/30/2019   ID:  John [name], DOB 1966/02/17, MRN [id number]  Location: patient's home  Provider location:  [facility]  Evaluation Performed: Follow-up visit  PCP:  Manon Hilding, MD   Electrophysiologist:  Dr Rayann Heman  Chief Complaint:  palpitations  History of Present Illness:    John [name] is a 53 y.o. male who presents via telehealth conferencing today.  Since last being seen in our clinic, the patient reports doing very well.  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  He feels "wore out". Today, he denies symptoms of palpitations, chest pain, shortness of breath,  lower extremity edema, dizziness, presyncope, or syncope.  The patient is otherwise without complaint today.  The patient denies symptoms of fevers, chills, cough, or new SOB worrisome for COVID 19.  Past Medical History:  Diagnosis Date  . Chronic lower back pain    "q hs when I lay down in bed" (08/24/2013)  . Dyspnea on exertion    "since ~ 05/2013" (08/24/2013)  . Family history of anesthesia complication    "Mom; forgot what happens" (08/24/2013)  . Sinus bradycardia 08/12/2013    Past Surgical History:  Procedure Laterality Date  . BACK SURGERY  1992-94   ruptured disc  . EXTRACORPOREAL SHOCK WAVE LITHOTRIPSY Right 04/08/2019   Procedure: EXTRACORPOREAL SHOCK WAVE LITHOTRIPSY (ESWL);  Surgeon: Irine Seal, MD;  Location: [facility];  Service: Urology;  Laterality: Right;  . LEFT HEART CATHETERIZATION WITH CORONARY ANGIOGRAM N/A 04/08/2014   Procedure: LEFT HEART  CATHETERIZATION WITH CORONARY ANGIOGRAM;  Surgeon: Sinclair Grooms, MD;  Location: [facility] CATH LAB;  Service: Cardiovascular;  Laterality: N/A;  . LUMBAR [place]; 1994   "disc ruptured twice" (08/24/2013)  . PACEMAKER INSERTION  08/24/2013   Biotronik dual chamber PPM implanted by Dr Rayann Heman  . PERMANENT PACEMAKER INSERTION N/A 08/24/2013   Procedure: PERMANENT PACEMAKER INSERTION;  Surgeon: Coralyn Mark, MD;  Location: [facility] CATH LAB;  Service: Cardiovascular;  Laterality: N/A;  . TONSILLECTOMY AND ADENOIDECTOMY  ~ 1977    No current outpatient medications on file.   No current facility-administered medications for this visit.     Allergies:   Penicillins, Codeine, and Morphine and related   Social History:  The patient  reports that he has never smoked. He has never used smokeless tobacco. He reports current alcohol use. He reports that he does not use drugs.   Family History:  The patient's  family history includes Diabetes in his father and mother.   ROS:  Please see the history of present illness.   All other systems are personally reviewed and negative.    Exam:    Vital Signs:  Ht 5\' 9"  (1.753 m)   Wt 170 lb (77.1 kg)   BMI 25.10 kg/m   Well sounding, alert and conversant   Labs/Other Tests and Data Reviewed:    Recent Labs: No results found for requested labs within last 8760 hours.   Wt Readings from Last 3 Encounters:  08/30/19  170 lb (77.1 kg)  04/08/19 165 lb (74.8 kg)  02/12/19 166 lb (75.3 kg)     Last device remote is reviewed from [employer] PDF which reveals normal device function, no arrhythmias    ASSESSMENT & PLAN:    1.  Sinus bradycardia Remotes are uptodate Normal device function  Follow-up:  12 months with EP APP   Patient Risk:  after full review of this patients clinical status, I feel that they are at moderate risk at this time.  Today, I have spent 15 minutes with the patient with telehealth technology discussing arrhythmia  management .    Randolm Idol, MD  08/30/2019 3:56 PM     [facility] [address] [place] [address] [phone] (office) [phone] (fax)

## 2019-09-24 IMAGING — XA DG INJECT/[PERSON_NAME] INC NEEDLE/CATH/PLC EPI/CERV/THOR W/IMG
2 series · 2 of 2 positions shown · non-contrast
Comparison: none

CLINICAL DATA: Spondylosis with radiculopathy. Bilateral neck and
shoulder pain. Symptoms are worse on the right.

[Series 1: ortho standard · 1 of 1 slices shown (1 of 2)]
[im 1/1]
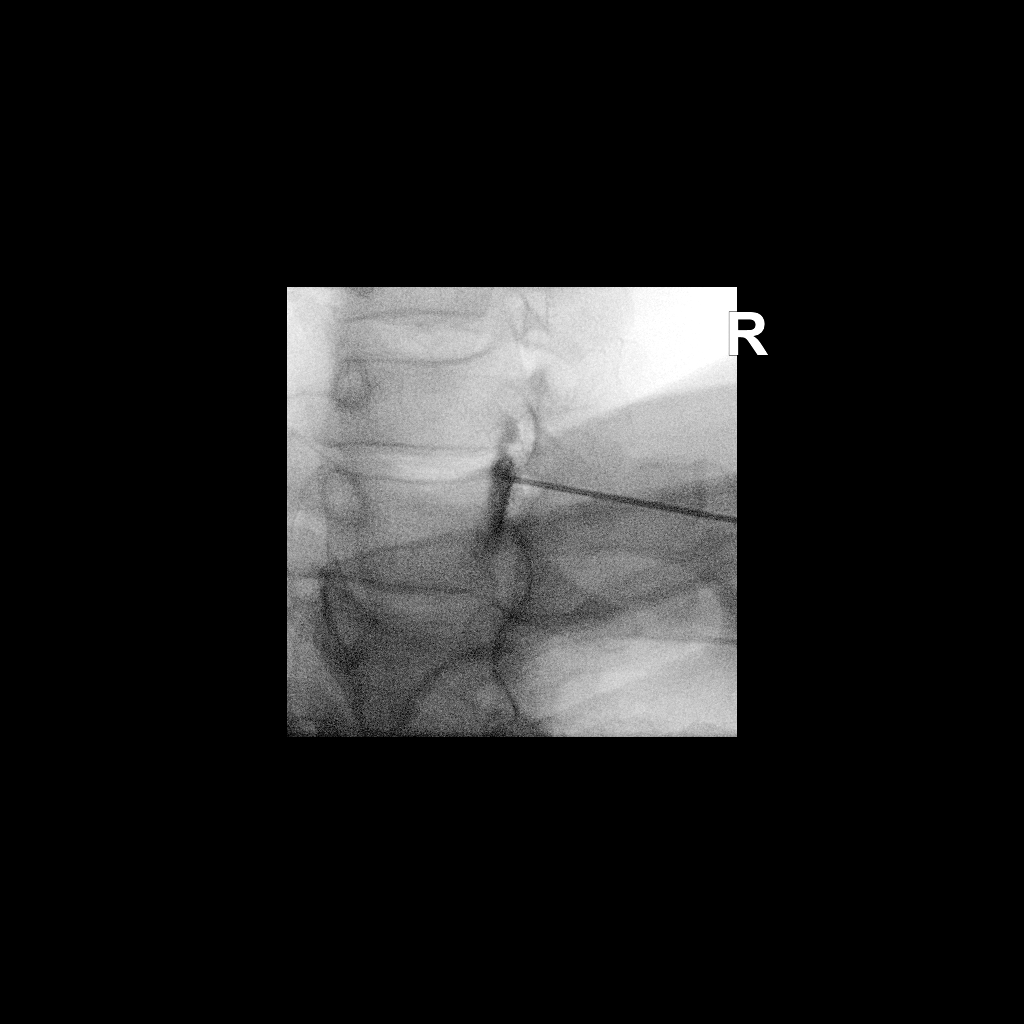

[Series 2: ortho standard · 1 of 1 slices shown (2 of 2)]
[im 1/1]
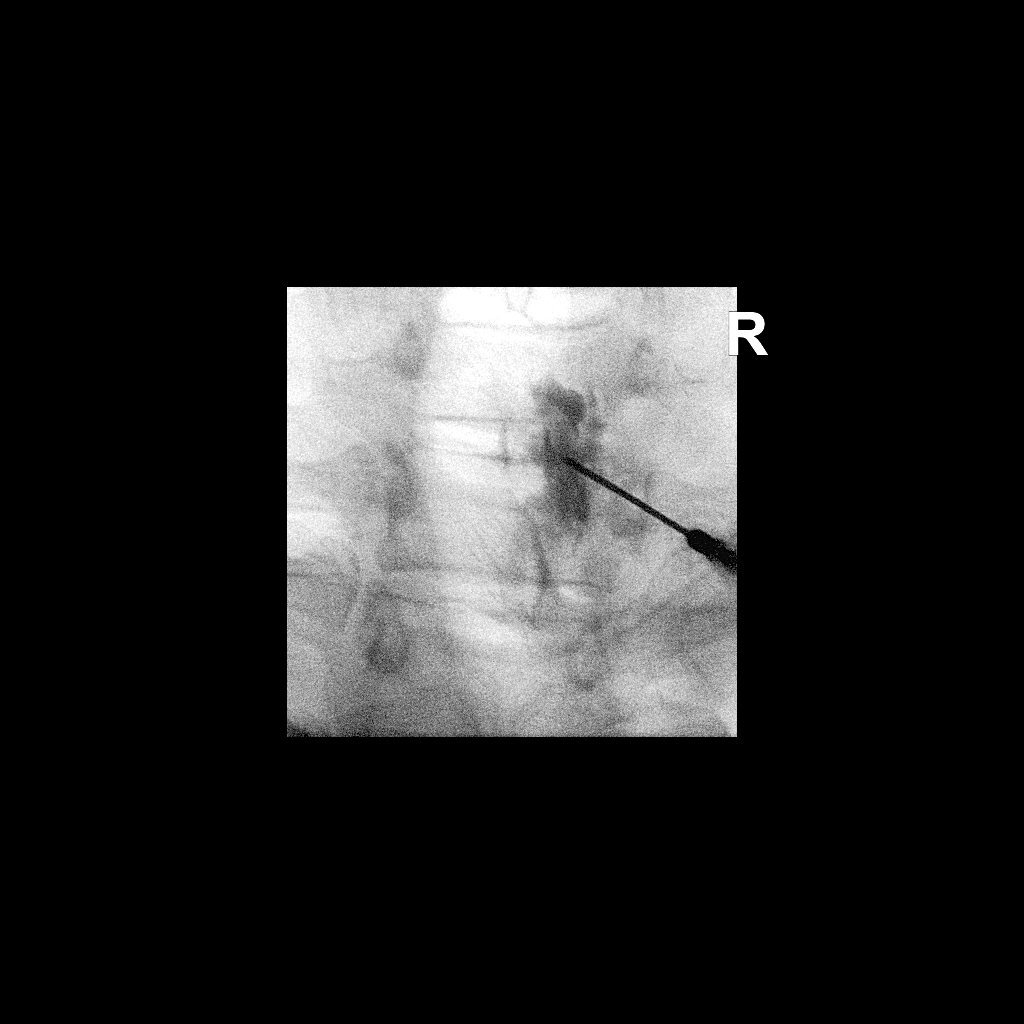

[2 of 2 positions shown; findings below may reference images not displayed]

FLUOROSCOPY TIME:  Radiation Exposure Index (as provided by the
fluoroscopic device): 0.87 uGy*m2

Fluoroscopy Time:  15 seconds

Number of Acquired Images:  0

PROCEDURE:
CERVICAL EPIDURAL INJECTION

An interlaminar approach was performed on the right at C7-T1 . A 20
gauge epidural needle was advanced using loss-of-resistance
technique.

DIAGNOSTIC EPIDURAL INJECTION

Injection of Isovue-M 300 shows a good epidural pattern with spread
above and below the level of needle placement, primarily on the
right. No vascular opacification is seen. THERAPEUTIC

EPIDURAL INJECTION

1.5 ml of Kenalog 40 mixed with 1 ml of 1% Lidocaine and 2 ml of
normal saline were then instilled. The procedure was well-tolerated,
and the patient was discharged thirty minutes following the
injection in good condition.
IMPRESSION: Technically successful first epidural injection on the right at
C7-T1.

## 2019-11-12 ENCOUNTER — Ambulatory Visit (INDEPENDENT_AMBULATORY_CARE_PROVIDER_SITE_OTHER): Payer: Managed Care, Other (non HMO) | Admitting: *Deleted

## 2019-11-12 DIAGNOSIS — R001 Bradycardia, unspecified: Secondary | ICD-10-CM | POA: Diagnosis not present

## 2019-11-12 LAB — CUP PACEART REMOTE DEVICE CHECK
Date Time Interrogation Session: 20210129074519
Implantable Lead Implant Date: 20141111
Implantable Lead Implant Date: 20141111
Implantable Lead Location: 753859
Implantable Lead Location: 753860
Implantable Lead Model: 350
Implantable Lead Model: 350
Implantable Lead Serial Number: 29446968
Implantable Lead Serial Number: 29497144
Implantable Pulse Generator Implant Date: 20141111
Pulse Gen Serial Number: 68069787

## 2019-11-12 NOTE — Progress Notes (Signed)
PPM Remote  

## 2019-12-28 ENCOUNTER — Encounter: Payer: Self-pay | Admitting: *Deleted

## 2019-12-29 ENCOUNTER — Other Ambulatory Visit: Payer: Self-pay

## 2019-12-29 ENCOUNTER — Ambulatory Visit: Payer: Managed Care, Other (non HMO) | Admitting: Diagnostic Neuroimaging

## 2019-12-29 ENCOUNTER — Encounter: Payer: Self-pay | Admitting: Diagnostic Neuroimaging

## 2019-12-29 VITALS — BP 111/71 | HR 76 | Temp 97.3°F | Ht 69.0 in | Wt 162.4 lb

## 2019-12-29 DIAGNOSIS — G40909 Epilepsy, unspecified, not intractable, without status epilepticus: Secondary | ICD-10-CM

## 2019-12-29 MED ORDER — LEVETIRACETAM 500 MG PO TABS: 500.0000 mg | ORAL_TABLET | Freq: Two times a day (BID) | ORAL | 12 refills | Status: DC

## 2019-12-29 NOTE — Progress Notes (Signed)
[facility]  PATIENT: John [name] DOB: 22-Dec-1965  REFERRING CLINICIAN: Sasser, Clarene Critchley, MD HISTORY FROM: patient and wife  REASON FOR VISIT: new consult    HISTORICAL  CHIEF COMPLAINT:  Chief Complaint  Patient presents with  . Seizures    rm 7 New Pt wife- John [name] "hx of sz as teen; two seizures on 11/19/19; stopped Keppra 2/14 due to fatigue"    HISTORY OF PRESENT ILLNESS:   54 year old male here for evaluation of seizures.    12/17/2019 patient had bowel incontinence at 3:00 in the morning while he was asleep.  This woke him up and he went to the bathroom to clean up.  He then felt hot and called out to his wife that he felt he may have a seizure.  He noticed shaking on his right side of his body, but this quickly progressed to generalized convulsions.  This lasted about 1 minute.  Afterwards he was very somnolent, groggy and confused.  Patient's wife and daughter were able to help him to the bathroom to get changed.  He was sitting down on the commode and then had a second seizure.  Patient was taken to the hospital emergency room for evaluation.  CT head, CTA head and neck, lab testing were unremarkable.  Patient was not able to have MRI due to history of pacemaker which was not certain if it was MRI compatible.  Patient was started on levetiracetam 500 mg twice a day.  Since that time patient took antiseizure medication for 4 days but then stopped.  He felt very tired and groggy while on medication.  Wife's friend also took Keppra and had some side effect and therefore they felt uncomfortable to continue.  Since that time he has not been back to work.  No further seizures.  He is feeling back to his baseline.  Of note patient had a few seizures at age 58 years old, was started on antiseizure medication for 1 to 2 years and then weaned off.  No further seizures since that time until March 2021.  Patient has not been back to work.  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  Sometimes he works on the floor with various tasks on his own pace in terms.  Other times he is asked to work on the [employer] where the work is faster pace and causes him some stress.  Patient has history of migraine headaches when he was younger, with throbbing severe pain, triggered by hot dogs, associated with nausea and photophobia.  Patient had milder but slightly similar headaches for 1 week prior to onset of recent seizures in March 2021.  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  Patient follows with Dr. Johney Frame.   REVIEW OF SYSTEMS: Full 14 system review of systems performed and negative with exception of: As per HPI.  ALLERGIES: Allergies  Allergen Reactions  . Penicillins Hives and Rash  . Codeine Nausea And Vomiting  . Morphine And Related Nausea And Vomiting    HOME MEDICATIONS: Outpatient Medications Prior to Visit  Medication Sig Dispense Refill  . levETIRAcetam (KEPPRA) 500 MG tablet Take 500 mg by mouth 2 (two) times daily.     No facility-administered medications prior to visit.    PAST MEDICAL HISTORY: Past Medical History:  Diagnosis Date  . Chronic lower back pain    "q hs when I lay down in bed" (08/24/2013)  . Dyspnea on exertion    "  since ~ 05/2013" (08/24/2013)  . Family history of anesthesia complication    "Mom; forgot what happens" (08/24/2013)  . Kidney stone   . Pacemaker   . 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 Sinus bradycardia 08/12/2013  . Syncope     PAST SURGICAL HISTORY: Past Surgical History:  Procedure Laterality Date  . BACK SURGERY  1992-94   ruptured disc  . EXTRACORPOREAL SHOCK WAVE LITHOTRIPSY Right 04/08/2019   Procedure: EXTRACORPOREAL SHOCK WAVE LITHOTRIPSY (ESWL);  Surgeon: Bjorn Pippin, MD;  Location[facility];  Service: Urology;  Laterality: Right;  . LEFT HEART CATHETERIZATION WITH CORONARY ANGIOGRAM N/A 04/08/2014    Procedure: LEFT HEART CATHETERIZATION WITH CORONARY ANGIOGRAM;  Surgeon: Lesleigh Noe, MD;  Location: [facility] CATH LAB;  Service: Cardiovascular;  Laterality: N/A;  . LUMBAR DISC SURGERY  1992; 1994   "disc ruptured twice" (08/24/2013)  . PACEMAKER INSERTION  08/24/2013   Biotronik dual chamber PPM implanted by Dr Johney Frame  . PERMANENT PACEMAKER INSERTION N/A 08/24/2013   Procedure: PERMANENT PACEMAKER INSERTION;  Surgeon: Gardiner Rhyme, MD;  Location: [facility] CATH LAB;  Service: Cardiovascular;  Laterality: N/A;  . TONSILLECTOMY AND ADENOIDECTOMY  ~ 1977    FAMILY HISTORY: Family History  Problem Relation Age of Onset  . Diabetes Father   . Diabetes Mother     SOCIAL HISTORY: Social History   Socioeconomic History  . Marital status: Married    Spouse name: John [name]  . Number of children: 2  . Years of education: 74  . Highest education level: Not on file  Occupational History  . Occupation: Administrator, Civil Service  Tobacco Use  . Smoking status: Never Smoker  . Smokeless tobacco: Never Used  Substance and Sexual Activity  . Alcohol use: Not Currently    Alcohol/week: 0.0 standard drinks    Comment: 08/24/2013 "maybe couple drinks/year"  . Drug use: No  . Sexual activity: Yes  Other Topics Concern  . Not on file  Social History Narrative   Lives with wife   Social Determinants of Health   Financial Resource Strain:   . Difficulty of Paying Living Expenses:   Food Insecurity:   . Worried About Programme researcher, broadcasting/film/video in the Last Year:   . Barista in the Last Year:   Transportation Needs:   . Freight forwarder (Medical):   Marland Kitchen Lack of Transportation (Non-Medical):   Physical Activity:   . Days of Exercise per Week:   . Minutes of Exercise per Session:   Stress:   . Feeling of Stress :   Social Connections:   . Frequency of Communication with Friends and Family:   . Frequency of Social Gatherings with Friends and Family:   . Attends Religious Services:   . Active  Member of Clubs or Organizations:   . Attends Banker Meetings:   Marland Kitchen Marital Status:   Intimate Partner Violence:   . Fear of Current or Ex-Partner:   . Emotionally Abused:   Marland Kitchen Physically Abused:   . Sexually Abused:      PHYSICAL EXAM  GENERAL EXAM/CONSTITUTIONAL: Vitals:  Vitals:   12/29/19 1508  BP: 111/71  Pulse: 76  Temp: (!) 97.3 F (36.3 C)  Weight: 162 lb 6.4 oz (73.7 kg)  Height: 5\' 9"  (1.753 m)     Body mass index is 23.98 kg/m. Wt Readings from Last 3 Encounters:  12/29/19 162 lb 6.4 oz (73.7 kg)  08/30/19  170 lb (77.1 kg)  04/08/19 165 lb (74.8 kg)     Patient is in no distress; well developed, nourished and groomed; neck is supple  CARDIOVASCULAR:  Examination of carotid arteries is normal; no carotid bruits  Regular rate and rhythm, no murmurs  Examination of peripheral vascular system by observation and palpation is normal  EYES:  Ophthalmoscopic exam of optic discs and posterior segments is normal; no papilledema or hemorrhages  No exam data present  MUSCULOSKELETAL:  Gait, strength, tone, movements noted in Neurologic exam below  NEUROLOGIC: MENTAL STATUS:  No flowsheet data found.  awake, alert, oriented to person, place and time  recent and remote memory intact  normal attention and concentration  language fluent, comprehension intact, naming intact  fund of knowledge appropriate  CRANIAL NERVE:   2nd - no papilledema on fundoscopic exam  2nd, 3rd, 4th, 6th - pupils equal and reactive to light, visual fields full to confrontation, extraocular muscles intact, no nystagmus  5th - facial sensation symmetric  7th - facial strength symmetric  8th - hearing intact  9th - palate elevates symmetrically, uvula midline  11th - shoulder shrug symmetric  12th - tongue protrusion midline  MOTOR:   normal bulk and tone, full strength in the BUE, BLE  SENSORY:   normal and symmetric to light touch,  temperature, vibration  COORDINATION:   finger-nose-finger, fine finger movements normal  REFLEXES:   deep tendon reflexes present and symmetric  GAIT/STATION:   narrow based gait     DIAGNOSTIC DATA (LABS, IMAGING, TESTING) - I reviewed patient records, labs, notes, testing and imaging myself where available.  Lab Results  Component Value Date   WBC 4.8 04/08/2014   HGB 13.6 04/08/2014   HCT 39.9 04/08/2014   MCV 82.3 04/08/2014   PLT 190 04/08/2014      Component Value Date/Time   NA 143 04/08/2014 1044   K 4.3 04/08/2014 1044   CL 105 04/08/2014 1044   CO2 25 04/08/2014 1044   GLUCOSE 97 04/08/2014 1044   BUN 16 04/08/2014 1044   CREATININE 0.94 04/08/2014 1044   CALCIUM 9.0 04/08/2014 1044   GFRNONAA >90 04/08/2014 1044   GFRAA >90 04/08/2014 1044   No results found for: CHOL, HDL, LDLCALC, LDLDIRECT, TRIG, CHOLHDL No results found for: EAVW0J No results found for: VITAMINB12 No results found for: TSH   12/17/19 CT head / CTA head / neck [reports] - negative    ASSESSMENT AND PLAN  54 y.o. year old male here with:  Dx:  1. Seizure disorder [facility])      PLAN:  Localization related seizure disorder (since age 33 years old; recurrence in March 2021) - consider MRI brain (will ask Dr. Johney Frame if MRI compatible)  - start levetiracetam 500mg  twice a day   - According to [place] law, you can not drive unless you are seizure / syncope free for at least 6 months and under physician's care.   - Please maintain precautions. Do not participate in activities where a loss of awareness could harm you or someone else. No swimming alone, no tub bathing, no hot tubs, no driving, no operating motorized vehicles (cars, ATVs, motocycles, etc), lawnmowers, power tools or firearms. No standing at heights, such as rooftops, ladders or stairs. Avoid hot objects such as stoves, heaters, open fires. Wear a helmet when riding a bicycle, scooter, skateboard, etc. and avoid areas  of traffic. Set your water heater to 120 degrees or less.  - may gradually  return to work next week as tolerated; patient will discuss with supervisor   Meds ordered this encounter  Medications  . levETIRAcetam (KEPPRA) 500 MG tablet    Sig: Take 1 tablet (500 mg total) by mouth 2 (two) times daily.    Dispense:  60 tablet    Refill:  12   Return in about 6 months (around 06/30/2020).  I reviewed images, labs, notes, records myself. I summarized findings and reviewed with patient, for this high risk condition (seizure disorder) requiring high complexity decision making.     Penni Bombard, MD 7/42/5956, 3:87 PM Certified in Neurology, Neurophysiology and Neuroimaging  [facility] [address] [place],  [address] [phone]

## 2019-12-29 NOTE — Patient Instructions (Signed)
Localization related seizure disorder (since age 54 years old; recurrence in March 2021)  - consider MRI brain (will ask Dr. Johney Frame if MRI compatible)  - start levetiracetam 500mg  twice a day   - According to [place] law, you can not drive unless you are seizure / syncope free for at least 6 months and under physician's care.   - Please maintain precautions. Do not participate in activities where a loss of awareness could harm you or someone else. No swimming alone, no tub bathing, no hot tubs, no driving, no operating motorized vehicles (cars, ATVs, motocycles, etc), lawnmowers, power tools or firearms. No standing at heights, such as rooftops, ladders or stairs. Avoid hot objects such as stoves, heaters, open fires. Wear a helmet when riding a bicycle, scooter, skateboard, etc. and avoid areas of traffic. Set your water heater to 120 degrees or less.  - may gradually return to work next week as tolerated; patient will discuss with supervisor

## 2020-01-04 ENCOUNTER — Encounter: Payer: Self-pay | Admitting: Diagnostic Neuroimaging

## 2020-01-04 ENCOUNTER — Telehealth: Payer: Self-pay | Admitting: Diagnostic Neuroimaging

## 2020-01-04 MED ORDER — LEVETIRACETAM 500 MG PO TABS: 1000.0000 mg | ORAL_TABLET | Freq: Two times a day (BID) | ORAL | 12 refills | Status: DC

## 2020-01-04 NOTE — Telephone Encounter (Signed)
Pt called to inform he had a seizure this morning at about 4am and he feels fine but wasn't sure if he needed to schedule another apt to go over medications

## 2020-01-04 NOTE — Telephone Encounter (Addendum)
Spoke with patient and advised him of new Rx sent in. I repeated new dose, advised he pick up new Rx but in meantime can use 500 mg tabs he has, taking two twice a day. 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 I advised he needs to talk to his employer. He understands he is not to drive for 6 months from last seizure. He stated his headache is coming back; I advised he take OTC for headache as needed, call us for any questions, problems, concerns. Patient verbalized understanding, appreciation.'

## 2020-01-04 NOTE — Telephone Encounter (Signed)
Spoke with patient who denied missing med doses. He went to bed, typically doesn't sleep well. In the night his wife told him he was jerking, lasted about a minute. He was weak afterwards, and she helped him to bathroom where he vomited which he stated happened last time. He also had a bad headache which happened last time as well, took Advil with good relief. He stated he feels fine now.  I advised will discuss with MD and call him back. Patient verbalized understanding, appreciation.

## 2020-01-04 NOTE — Telephone Encounter (Signed)
Increase levetiracetam to 1000mg  twice a day.    Meds ordered this encounter  Medications  . levETIRAcetam (KEPPRA) 500 MG tablet    Sig: Take 2 tablets (1,000 mg total) by mouth 2 (two) times daily.    Dispense:  120 tablet    Refill:  12    , MD 01/04/2020, 11:26 AM Certified in Neurology, Neurophysiology and Neuroimaging  [facility] [address] [place], [place] [phone]

## 2020-01-05 NOTE — Telephone Encounter (Signed)
Called patient and advised he needs to talk to HR to get STD papers. He stated that he needs to be out of work past current return of April 4th due to flare up of sciatica issue brought on when he had seizure.  His PCP took him out of work; I advised he discuss remaining out of work with PCP.  I advised him our office policy on forms is a $50 fee paid before forms are filled out. He asked for our fax #, will talk to PCP about staying out of work,  verbalized understanding, appreciation.

## 2020-01-05 NOTE — Telephone Encounter (Signed)
Pt called wanting to know what steps he needs to take with his short term disability and he also states that he thinks he may need to stay out longer from work. Please advise.

## 2020-02-10 LAB — CUP PACEART REMOTE DEVICE CHECK
Battery Remaining Percentage: 55 %
Brady Statistic RA Percent Paced: 73 %
Brady Statistic RV Percent Paced: 0 %
Date Time Interrogation Session: 20210429065931
Implantable Lead Implant Date: 20141111
Implantable Lead Implant Date: 20141111
Implantable Lead Location: 753859
Implantable Lead Location: 753860
Implantable Lead Model: 350
Implantable Lead Model: 350
Implantable Lead Serial Number: 29446968
Implantable Lead Serial Number: 29497144
Implantable Pulse Generator Implant Date: 20141111
Lead Channel Impedance Value: 449 Ohm
Lead Channel Impedance Value: 585 Ohm
Lead Channel Pacing Threshold Amplitude: 0.5 V
Lead Channel Pacing Threshold Amplitude: 0.9 V
Lead Channel Pacing Threshold Pulse Width: 0.4 ms
Lead Channel Pacing Threshold Pulse Width: 0.4 ms
Lead Channel Sensing Intrinsic Amplitude: 13.5 mV
Lead Channel Sensing Intrinsic Amplitude: 2.9 mV
Lead Channel Setting Pacing Pulse Width: 0.4 ms
Pulse Gen Serial Number: 68069787

## 2020-02-11 ENCOUNTER — Ambulatory Visit (INDEPENDENT_AMBULATORY_CARE_PROVIDER_SITE_OTHER): Payer: Managed Care, Other (non HMO) | Admitting: *Deleted

## 2020-02-11 DIAGNOSIS — R001 Bradycardia, unspecified: Secondary | ICD-10-CM

## 2020-02-11 NOTE — Progress Notes (Signed)
PPM Remote  

## 2020-02-18 ENCOUNTER — Encounter: Payer: Managed Care, Other (non HMO) | Admitting: Internal Medicine

## 2020-05-12 ENCOUNTER — Ambulatory Visit (INDEPENDENT_AMBULATORY_CARE_PROVIDER_SITE_OTHER): Payer: Managed Care, Other (non HMO) | Admitting: *Deleted

## 2020-05-12 DIAGNOSIS — I495 Sick sinus syndrome: Secondary | ICD-10-CM | POA: Diagnosis not present

## 2020-05-12 LAB — CUP PACEART REMOTE DEVICE CHECK
Date Time Interrogation Session: 20210730084900
Implantable Lead Implant Date: 20141111
Implantable Lead Implant Date: 20141111
Implantable Lead Location: 753859
Implantable Lead Location: 753860
Implantable Lead Model: 350
Implantable Lead Model: 350
Implantable Lead Serial Number: 29446968
Implantable Lead Serial Number: 29497144
Implantable Pulse Generator Implant Date: 20141111
Pulse Gen Serial Number: 68069787

## 2020-05-16 NOTE — Progress Notes (Signed)
Remote pacemaker transmission.   

## 2020-07-03 ENCOUNTER — Other Ambulatory Visit: Payer: Self-pay

## 2020-07-03 ENCOUNTER — Ambulatory Visit (INDEPENDENT_AMBULATORY_CARE_PROVIDER_SITE_OTHER): Payer: Managed Care, Other (non HMO) | Admitting: Diagnostic Neuroimaging

## 2020-07-03 ENCOUNTER — Encounter: Payer: Self-pay | Admitting: Diagnostic Neuroimaging

## 2020-07-03 VITALS — BP 111/70 | HR 76 | Ht 69.0 in | Wt 167.0 lb

## 2020-07-03 DIAGNOSIS — G40909 Epilepsy, unspecified, not intractable, without status epilepticus: Secondary | ICD-10-CM

## 2020-07-03 MED ORDER — LEVETIRACETAM 1000 MG PO TABS: 1000.0000 mg | ORAL_TABLET | Freq: Two times a day (BID) | ORAL | 4 refills | Status: DC

## 2020-07-03 NOTE — Progress Notes (Signed)
[facility]  PATIENT: John [name] DOB: 1966-03-11  REFERRING CLINICIAN: Sasser, Clarene Critchley, MD HISTORY FROM: patient  REASON FOR VISIT: follow    HISTORICAL  CHIEF COMPLAINT:  Chief Complaint  Patient presents with  . Seizures    rm 7, 6 month FU  " no seizure activity"    HISTORY OF PRESENT ILLNESS:   UPDATE (07/03/20, VRP): Since last visit, had another seizure on 01/04/20, then LEV increased to 1000mg  twice a day, and since then doing well. Tolerating LEV.    PRIOR HPI (12/29/19): 54 year old male here for evaluation of seizures.    12/17/2019 patient had bowel incontinence at 3:00 in the morning while he was asleep.  This woke him up and he went to the bathroom to clean up.  He then felt hot and called out to his wife that he felt he may have a seizure.  He noticed shaking on his right side of his body, but this quickly progressed to generalized convulsions.  This lasted about 1 minute.  Afterwards he was very somnolent, groggy and confused.  Patient's wife and daughter were able to help him to the bathroom to get changed.  He was sitting down on the commode and then had a second seizure.  Patient was taken to the hospital emergency room for evaluation.  CT head, CTA head and neck, lab testing were unremarkable.  Patient was not able to have MRI due to history of pacemaker which was not certain if it was MRI compatible.  Patient was started on levetiracetam 500 mg twice a day.  Since that time patient took antiseizure medication for 4 days but then stopped.  He felt very tired and groggy while on medication.  Wife's friend also took Keppra and had some side effect and therefore they felt uncomfortable to continue.  Since that time he has not been back to work.  No further seizures.  He is feeling back to his baseline.  Of note patient had a few seizures at age 67 years old, was started on antiseizure medication for 1 to 2 years and then weaned off.  No further  seizures since that time until March 2021.  Patient has not been back to work.  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  Sometimes he works on the floor with various tasks on his own pace in terms.  Other times he is asked to work on the Nurse, children's where the work is faster pace and causes him some stress.  Patient has history of migraine headaches when he was younger, with throbbing severe pain, triggered by hot dogs, associated with nausea and photophobia.  Patient had milder but slightly similar headaches for 1 week prior to onset of recent seizures in March 2021.  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  Patient follows with Dr. 2015.   REVIEW OF SYSTEMS: Full 14 system review of systems performed and negative with exception of: as per HPI.   ALLERGIES: Allergies  Allergen Reactions  . Penicillins Hives and Rash  . Codeine Nausea And Vomiting  . Morphine And Related Nausea And Vomiting    HOME MEDICATIONS: Outpatient Medications Prior to Visit  Medication Sig Dispense Refill  . levETIRAcetam (KEPPRA) 500 MG tablet Take 2 tablets (1,000 mg total) by mouth 2 (two) times daily. 120 tablet 12   No facility-administered medications prior to visit.    PAST MEDICAL HISTORY: Past Medical History:  Diagnosis Date  .  Chronic lower back pain    "q hs when I lay down in bed" (08/24/2013)  . Dyspnea on exertion    "since ~ 05/2013" (08/24/2013)  . Family history of anesthesia complication    "Mom; forgot what happens" (08/24/2013)  . Kidney stone   . Pacemaker   . 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 Sinus bradycardia 08/12/2013  . Syncope     PAST SURGICAL HISTORY: Past Surgical History:  Procedure Laterality Date  . BACK SURGERY  1992-94   ruptured disc  . EXTRACORPOREAL SHOCK WAVE LITHOTRIPSY Right 04/08/2019   Procedure: EXTRACORPOREAL SHOCK WAVE LITHOTRIPSY (ESWL);  Surgeon:  Bjorn Pippin, MD;  Location: [facility];  Service: Urology;  Laterality: Right;  . LEFT HEART CATHETERIZATION WITH CORONARY ANGIOGRAM N/A 04/08/2014   Procedure: LEFT HEART CATHETERIZATION WITH CORONARY ANGIOGRAM;  Surgeon: Lesleigh Noe, MD;  Location: [facility] CATH LAB;  Service: Cardiovascular;  Laterality: N/A;  . LUMBAR DISC SURGERY  1992; 1994   "disc ruptured twice" (08/24/2013)  . PACEMAKER INSERTION  08/24/2013   Biotronik dual chamber PPM implanted by Dr Johney Frame  . PERMANENT PACEMAKER INSERTION N/A 08/24/2013   Procedure: PERMANENT PACEMAKER INSERTION;  Surgeon: Gardiner Rhyme, MD;  Location: [facility] CATH LAB;  Service: Cardiovascular;  Laterality: N/A;  . TONSILLECTOMY AND ADENOIDECTOMY  ~ 1977    FAMILY HISTORY: Family History  Problem Relation Age of Onset  . Diabetes Father   . Diabetes Mother     SOCIAL HISTORY: Social History   Socioeconomic History  . Marital status: Married    Spouse name: [name]  . Number of children: 2  . Years of education: 31  . Highest education level: Not on file  Occupational History  . Occupation: Administrator, Civil Service    Comment: first shift  Tobacco Use  . Smoking status: Never Smoker  . Smokeless tobacco: Never Used  Substance and Sexual Activity  . Alcohol use: Not Currently    Alcohol/week: 0.0 standard drinks    Comment: 08/24/2013 "maybe couple drinks/year"  . Drug use: No  . Sexual activity: Yes  Other Topics Concern  . Not on file  Social History Narrative   Lives with wife   Social Determinants of Health   Financial Resource Strain:   . Difficulty of Paying Living Expenses: Not on file  Food Insecurity:   . Worried About Programme researcher, broadcasting/film/video in the Last Year: Not on file  . Ran Out of Food in the Last Year: Not on file  Transportation Needs:   . Lack of Transportation (Medical): Not on file  . Lack of Transportation (Non-Medical): Not on file  Physical Activity:   . Days of Exercise per Week: Not on file  . Minutes of Exercise  per Session: Not on file  Stress:   . Feeling of Stress : Not on file  Social Connections:   . Frequency of Communication with Friends and Family: Not on file  . Frequency of Social Gatherings with Friends and Family: Not on file  . Attends Religious Services: Not on file  . Active Member of Clubs or Organizations: Not on file  . Attends Banker Meetings: Not on file  . Marital Status: Not on file  Intimate Partner Violence:   . Fear of Current or Ex-Partner: Not on file  . Emotionally Abused: Not on file  . Physically Abused: Not on file  . Sexually Abused: Not on file  PHYSICAL EXAM  GENERAL EXAM/CONSTITUTIONAL: Vitals:  Vitals:   07/03/20 1500  BP: 111/70  Pulse: 76  Weight: 167 lb (75.8 kg)  Height: 5\' 9"  (1.753 m)   Body mass index is 24.66 kg/m. Wt Readings from Last 3 Encounters:  07/03/20 167 lb (75.8 kg)  12/29/19 162 lb 6.4 oz (73.7 kg)  08/30/19 170 lb (77.1 kg)    Patient is in no distress; well developed, nourished and groomed; neck is supple  CARDIOVASCULAR:  Examination of carotid arteries is normal; no carotid bruits  Regular rate and rhythm, no murmurs  Examination of peripheral vascular system by observation and palpation is normal  EYES:  Ophthalmoscopic exam of optic discs and posterior segments is normal; no papilledema or hemorrhages No exam data present  MUSCULOSKELETAL:  Gait, strength, tone, movements noted in Neurologic exam below  NEUROLOGIC: MENTAL STATUS:  No flowsheet data found.  awake, alert, oriented to person, place and time  recent and remote memory intact  normal attention and concentration  language fluent, comprehension intact, naming intact  fund of knowledge appropriate  CRANIAL NERVE:   2nd - no papilledema on fundoscopic exam  2nd, 3rd, 4th, 6th - pupils equal and reactive to light, visual fields full to confrontation, extraocular muscles intact, no nystagmus  5th - facial sensation  symmetric  7th - facial strength symmetric  8th - hearing intact  9th - palate elevates symmetrically, uvula midline  11th - shoulder shrug symmetric  12th - tongue protrusion midline  MOTOR:   normal bulk and tone, full strength in the BUE, BLE  SENSORY:   normal and symmetric to light touch, temperature, vibration  COORDINATION:   finger-nose-finger, fine finger movements normal  REFLEXES:   deep tendon reflexes present and symmetric  GAIT/STATION:   narrow based gait     DIAGNOSTIC DATA (LABS, IMAGING, TESTING) - I reviewed patient records, labs, notes, testing and imaging myself where available.  Lab Results  Component Value Date   WBC 4.8 04/08/2014   HGB 13.6 04/08/2014   HCT 39.9 04/08/2014   MCV 82.3 04/08/2014   PLT 190 04/08/2014      Component Value Date/Time   NA 143 04/08/2014 1044   K 4.3 04/08/2014 1044   CL 105 04/08/2014 1044   CO2 25 04/08/2014 1044   GLUCOSE 97 04/08/2014 1044   BUN 16 04/08/2014 1044   CREATININE 0.94 04/08/2014 1044   CALCIUM 9.0 04/08/2014 1044   GFRNONAA >90 04/08/2014 1044   GFRAA >90 04/08/2014 1044   No results found for: CHOL, HDL, LDLCALC, LDLDIRECT, TRIG, CHOLHDL No results found for: 04/10/2014 No results found for: VITAMINB12 No results found for: TSH   12/17/19 CT head / CTA head / neck [reports] - negative    ASSESSMENT AND PLAN  54 y.o. year old male here with:  Dx:  1. Seizure disorder [facility])      PLAN:  Localization related seizure disorder (since age 40 years old; last seizure 01/04/20) - continue levetiracetam 1000mg  twice a day - may return to driving on 01/06/20 (if no seizures)  Meds ordered this encounter  Medications  . levETIRAcetam (KEPPRA) 1000 MG tablet    Sig: Take 1 tablet (1,000 mg total) by mouth 2 (two) times daily.    Dispense:  180 tablet    Refill:  4   Return in about 1 year (around 07/03/2021) for with NP (Amy Lomax), virtual visit (15 min).    7/48/27, MD 07/03/2020, 3:10 PM  Certified in Neurology, Neurophysiology and [facility] [address] [place], [place] [address] [phone]

## 2020-07-03 NOTE — Patient Instructions (Addendum)
Localization related seizure disorder (since age 54 years old; last seizure 01/04/20) - continue levetiracetam 1000mg  twice a day - may return to driving on (if no seizures)

## 2020-08-11 ENCOUNTER — Ambulatory Visit (INDEPENDENT_AMBULATORY_CARE_PROVIDER_SITE_OTHER): Payer: Managed Care, Other (non HMO)

## 2020-08-11 DIAGNOSIS — R001 Bradycardia, unspecified: Secondary | ICD-10-CM

## 2020-08-11 LAB — CUP PACEART REMOTE DEVICE CHECK
Date Time Interrogation Session: 20211028085136
Implantable Lead Implant Date: 20141111
Implantable Lead Implant Date: 20141111
Implantable Lead Location: 753859
Implantable Lead Location: 753860
Implantable Lead Model: 350
Implantable Lead Model: 350
Implantable Lead Serial Number: 29446968
Implantable Lead Serial Number: 29497144
Implantable Pulse Generator Implant Date: 20141111
Pulse Gen Serial Number: 68069787

## 2020-08-15 NOTE — Progress Notes (Signed)
Remote pacemaker transmission.   

## 2020-10-20 ENCOUNTER — Encounter: Payer: Self-pay | Admitting: Internal Medicine

## 2020-10-20 ENCOUNTER — Ambulatory Visit (INDEPENDENT_AMBULATORY_CARE_PROVIDER_SITE_OTHER): Payer: Managed Care, Other (non HMO) | Admitting: Internal Medicine

## 2020-10-20 VITALS — BP 118/78 | HR 97 | Resp 16 | Ht 69.0 in | Wt 165.8 lb

## 2020-10-20 DIAGNOSIS — R569 Unspecified convulsions: Secondary | ICD-10-CM | POA: Diagnosis not present

## 2020-10-20 DIAGNOSIS — R001 Bradycardia, unspecified: Secondary | ICD-10-CM

## 2020-10-20 LAB — CUP PACEART INCLINIC DEVICE CHECK
Date Time Interrogation Session: 20220107090839
Implantable Lead Implant Date: 20141111
Implantable Lead Implant Date: 20141111
Implantable Lead Location: 753859
Implantable Lead Location: 753860
Implantable Lead Model: 350
Implantable Lead Model: 350
Implantable Lead Serial Number: 29446968
Implantable Lead Serial Number: 29497144
Implantable Pulse Generator Implant Date: 20141111
Lead Channel Pacing Threshold Amplitude: 0.5 V
Lead Channel Pacing Threshold Amplitude: 0.8 V
Lead Channel Pacing Threshold Pulse Width: 0.4 ms
Lead Channel Pacing Threshold Pulse Width: 0.4 ms
Lead Channel Sensing Intrinsic Amplitude: 11 mV
Lead Channel Sensing Intrinsic Amplitude: 3.2 mV
Pulse Gen Serial Number: 68069787

## 2020-10-20 NOTE — Progress Notes (Signed)
    PCP: John Pandy, MD   Primary EP:  John [name] is a 55 y.o. male who presents today for routine electrophysiology followup.  Since last being seen in our clinic, the patient reports doing very well.  Today, he denies symptoms of palpitations, chest pain, shortness of breath,  lower extremity edema, dizziness, presyncope, or syncope.  The patient is otherwise without complaint today.   Past Medical History:  Diagnosis Date  . Chronic lower back pain    "q hs when I lay down in bed" (08/24/2013)  . Dyspnea on exertion    "since ~ 05/2013" (08/24/2013)  . Family history of anesthesia complication    "Mom; forgot what happens" (08/24/2013)  . Kidney stone   . Pacemaker   . 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 Sinus bradycardia 08/12/2013  . Syncope    Past Surgical History:  Procedure Laterality Date  . BACK SURGERY  1992-94   ruptured disc  . EXTRACORPOREAL SHOCK WAVE LITHOTRIPSY Right 04/08/2019   Procedure: EXTRACORPOREAL SHOCK WAVE LITHOTRIPSY (ESWL);  Surgeon: Bjorn Pippin, MD;  Location: [facility];  Service: Urology;  Laterality: Right;  . LEFT HEART CATHETERIZATION WITH CORONARY ANGIOGRAM N/A 04/08/2014   Procedure: LEFT HEART CATHETERIZATION WITH CORONARY ANGIOGRAM;  Surgeon: Lesleigh Noe, MD;  Location: [facility] CATH LAB;  Service: Cardiovascular;  Laterality: N/A;  . LUMBAR DISC SURGERY  1992; 1994   "disc ruptured twice" (08/24/2013)  . PACEMAKER INSERTION  08/24/2013   Biotronik dual chamber PPM implanted by John Johney Frame  . PERMANENT PACEMAKER INSERTION N/A 08/24/2013   Procedure: PERMANENT PACEMAKER INSERTION;  Surgeon: Gardiner Rhyme, MD;  Location: [facility] CATH LAB;  Service: Cardiovascular;  Laterality: N/A;  . TONSILLECTOMY AND ADENOIDECTOMY  ~ 1977    ROS- all systems are reviewed and negative except as per HPI above  Current Outpatient Medications  Medication Sig Dispense Refill  . levETIRAcetam (KEPPRA) 1000 MG tablet Take 1  tablet (1,000 mg total) by mouth 2 (two) times daily. 180 tablet 4   No current facility-administered medications for this visit.    Physical Exam: Vitals:   10/20/20 0850  BP: 118/78  Pulse: 97  Resp: 16  SpO2: 97%  Weight: 165 lb 12.8 oz (75.2 kg)  Height: 5\' 9"  (1.753 m)    GEN- The patient is well appearing, alert and oriented x 3 today.   Head- normocephalic, atraumatic Eyes-  Sclera clear, conjunctiva pink Ears- hearing intact Oropharynx- clear Lungs- Clear to ausculation bilaterally, normal work of breathing Chest- pacemaker pocket is well healed Heart- Regular rate and rhythm, no murmurs, rubs or gallops, PMI not laterally displaced GI- soft, NT, ND, + BS Extremities- no clubbing, cyanosis, or edema  Pacemaker interrogation- reviewed in detail today,  See PACEART report  ekg tracing ordered today is personally reviewed and shows sinus  Assessment and Plan:  1. Symptomatic sinus bradycardia  Normal pacemaker function See Pace Art report No changes today he is not device dependant today  2. Seizures John note reviewed He has requested MRI.  The patient's device is not technically MRI conditional, though I think that I could help arrange an MRI for the patient if medically indicated. The patient reports that he has not had a seizure since March and does not wish to proceed with MRI at this time.   Return in a year  April MD, [facility] 10/20/2020 9:20 AM

## 2020-10-20 NOTE — Patient Instructions (Signed)
Medication Instructions:   Your physician recommends that you continue on your current medications as directed. Please refer to the Current Medication list given to you today.  Labwork:  None  Testing/Procedures:  None  Follow-Up:  Your physician recommends that you schedule a follow-up appointment in: 1 year.  Any Other Special Instructions Will Be Listed Below (If Applicable).  If you need a refill on your cardiac medications before your next appointment, please call your pharmacy. 

## 2020-11-09 LAB — CUP PACEART REMOTE DEVICE CHECK
Battery Remaining Percentage: 50 %
Brady Statistic RA Percent Paced: 78 %
Brady Statistic RV Percent Paced: 0 %
Date Time Interrogation Session: 20220127075517
Implantable Lead Implant Date: 20141111
Implantable Lead Implant Date: 20141111
Implantable Lead Location: 753859
Implantable Lead Location: 753860
Implantable Lead Model: 350
Implantable Lead Model: 350
Implantable Lead Serial Number: 29446968
Implantable Lead Serial Number: 29497144
Implantable Pulse Generator Implant Date: 20141111
Lead Channel Impedance Value: 468 Ohm
Lead Channel Impedance Value: 566 Ohm
Lead Channel Pacing Threshold Amplitude: 0.5 V
Lead Channel Pacing Threshold Amplitude: 0.8 V
Lead Channel Pacing Threshold Pulse Width: 0.4 ms
Lead Channel Pacing Threshold Pulse Width: 0.4 ms
Lead Channel Sensing Intrinsic Amplitude: 1.8 mV
Lead Channel Sensing Intrinsic Amplitude: 11.4 mV
Lead Channel Setting Pacing Pulse Width: 0.4 ms
Pulse Gen Serial Number: 68069787

## 2020-11-10 ENCOUNTER — Ambulatory Visit (INDEPENDENT_AMBULATORY_CARE_PROVIDER_SITE_OTHER): Payer: Managed Care, Other (non HMO)

## 2020-11-10 DIAGNOSIS — I495 Sick sinus syndrome: Secondary | ICD-10-CM | POA: Diagnosis not present

## 2020-11-18 NOTE — Progress Notes (Signed)
Remote pacemaker transmission.   

## 2021-01-07 ENCOUNTER — Other Ambulatory Visit: Payer: Self-pay | Admitting: Diagnostic Neuroimaging

## 2021-01-09 ENCOUNTER — Telehealth: Payer: Self-pay | Admitting: *Deleted

## 2021-01-09 ENCOUNTER — Other Ambulatory Visit: Payer: Self-pay | Admitting: Diagnostic Neuroimaging

## 2021-01-09 NOTE — Telephone Encounter (Signed)
Received two refill requests for levertiracetam 500 mg tabs.  Refills were refused. Received call from Jesusita Oka, at pharmacy stating patient  is asking  for 500 mg tablets. I informed her that he is on 1000 mg twice a day, no documentation that the dose has been decreased. I advised Dawn I will call patient to discuss this. She  verbalized understanding, appreciation.

## 2021-01-09 NOTE — Telephone Encounter (Signed)
Called patient, no answer, no VM. Will try tomorrow.

## 2021-01-10 NOTE — Telephone Encounter (Signed)
Called patient, LVM #2 requesting call back to discuss his request to refill levertircetam in 500 mg tablets.

## 2021-01-10 NOTE — Telephone Encounter (Signed)
Patient called back, and I asked why he wants 500 mg tablets. He stated that is what he has been taking. He got keppra bottle and stated they are 500 mg, take 2 tabs twice daily.  Rx expired 01/03/2021. I advised phamracy can't refill it again. Advised he call Dawn at [place], Jonita Albee and ask her to fill Rx Dr Marjory Lies sent in Sept 2021.  Patient verbalized understanding, appreciation.

## 2021-02-06 LAB — CUP PACEART REMOTE DEVICE CHECK
Battery Remaining Percentage: 45 %
Brady Statistic RA Percent Paced: 75 %
Brady Statistic RV Percent Paced: 0 %
Date Time Interrogation Session: 20220426070005
Implantable Lead Implant Date: 20141111
Implantable Lead Implant Date: 20141111
Implantable Lead Location: 753859
Implantable Lead Location: 753860
Implantable Lead Model: 350
Implantable Lead Model: 350
Implantable Lead Serial Number: 29446968
Implantable Lead Serial Number: 29497144
Implantable Pulse Generator Implant Date: 20141111
Lead Channel Impedance Value: 449 Ohm
Lead Channel Pacing Threshold Amplitude: 0.5 V
Lead Channel Pacing Threshold Pulse Width: 0.4 ms
Lead Channel Sensing Intrinsic Amplitude: 11.8 mV
Lead Channel Sensing Intrinsic Amplitude: 2.9 mV
Lead Channel Setting Pacing Amplitude: 1.6 V
Lead Channel Setting Pacing Amplitude: 1.8 V
Lead Channel Setting Pacing Pulse Width: 0.4 ms
Pulse Gen Serial Number: 68069787

## 2021-02-09 ENCOUNTER — Ambulatory Visit (INDEPENDENT_AMBULATORY_CARE_PROVIDER_SITE_OTHER): Payer: Managed Care, Other (non HMO)

## 2021-02-09 DIAGNOSIS — I495 Sick sinus syndrome: Secondary | ICD-10-CM

## 2021-03-01 NOTE — Progress Notes (Signed)
Remote pacemaker transmission.   

## 2021-05-11 ENCOUNTER — Ambulatory Visit (INDEPENDENT_AMBULATORY_CARE_PROVIDER_SITE_OTHER): Payer: Managed Care, Other (non HMO)

## 2021-05-11 DIAGNOSIS — I495 Sick sinus syndrome: Secondary | ICD-10-CM | POA: Diagnosis not present

## 2021-05-12 LAB — CUP PACEART REMOTE DEVICE CHECK
Date Time Interrogation Session: 20220727101707
Implantable Lead Implant Date: 20141111
Implantable Lead Implant Date: 20141111
Implantable Lead Location: 753859
Implantable Lead Location: 753860
Implantable Lead Model: 350
Implantable Lead Model: 350
Implantable Lead Serial Number: 29446968
Implantable Lead Serial Number: 29497144
Implantable Pulse Generator Implant Date: 20141111
Pulse Gen Serial Number: 68069787

## 2021-06-06 NOTE — Progress Notes (Signed)
Remote pacemaker transmission.   

## 2021-07-03 ENCOUNTER — Telehealth: Payer: Self-pay | Admitting: Family Medicine

## 2021-07-03 ENCOUNTER — Telehealth: Payer: Managed Care, Other (non HMO) | Admitting: Family Medicine

## 2021-07-03 NOTE — Progress Notes (Deleted)
PATIENT: John [name] DOB: 1966/01/26  REASON FOR VISIT: follow up HISTORY FROM: patient  Virtual Visit via Telephone Note  I connected with [name] on 07/03/21 at  3:30 PM EDT by telephone and verified that I am speaking with the correct person using two identifiers.   I discussed the limitations, risks, security and privacy concerns of performing an evaluation and management service by telephone and the availability of in person appointments. I also discussed with the patient that there may be a patient responsible charge related to this service. The patient expressed understanding and agreed to proceed.   History of Present Illness:  07/03/21 ALL: John [name] is a 55 y.o. male here today for follow up for seizures. He continues levetiracetam 1000mg  BID.    History (copied from Dr previous note)  UPDATE (07/03/20, VRP): Since last visit, had another seizure on 01/04/20, then LEV increased to 1000mg  twice a day, and since then doing well. Tolerating LEV.     PRIOR HPI (12/29/19): 55 year old male here for evaluation of seizures.     12/17/2019 patient had bowel incontinence at 3:00 in the morning while he was asleep.  This woke him up and he went to the bathroom to clean up.  He then felt hot and called out to his wife that he felt he may have a seizure.  He noticed shaking on his right side of his body, but this quickly progressed to generalized convulsions.  This lasted about 1 minute.  Afterwards he was very somnolent, groggy and confused.  Patient's wife and daughter were able to help him to the bathroom to get changed.  He was sitting down on the commode and then had a second seizure.  Patient was taken to the hospital emergency room for evaluation.  CT head, CTA head and neck, lab testing were unremarkable.  Patient was not able to have MRI due to history of pacemaker which was not certain if it was MRI compatible.  Patient was started on levetiracetam 500  mg twice a day.   Since that time patient took antiseizure medication for 4 days but then stopped.  He felt very tired and groggy while on medication.  Wife's friend also took Keppra and had some side effect and therefore they felt uncomfortable to continue.  Since that time he has not been back to work.  No further seizures.  He is feeling back to his baseline.   Of note patient had a few seizures at age 50 years old, was started on antiseizure medication for 1 to 2 years and then weaned off.  No further seizures since that time until March 2021.   Patient has not been back to work.  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  Sometimes he works on the floor with various tasks on his own pace in terms.  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   Patient has history of migraine headaches when he was younger, with throbbing severe pain, triggered by hot dogs, associated with nausea and photophobia.  Patient had milder but slightly similar headaches for 1 week prior to onset of recent seizures in March 2021.   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  Patient follows with Dr. April 2021.   Observations/Objective:  Generalized: Well developed, in no acute distress  Mentation: Alert oriented to time, place,  history taking. Follows all commands speech and language fluent   Assessment and Plan:  55 y.o. year old male  has a past medical history of Chronic lower back pain, Dyspnea on exertion, Family history of anesthesia complication, Kidney stone, Pacemaker, Seizure (HCC), Sinus bradycardia (08/12/2013), and Syncope. here with  No diagnosis found.  No orders of the defined types were placed in this encounter.   No orders of the defined types were placed in this encounter.    Follow Up Instructions:  I discussed the assessment and treatment plan with the  patient. The patient was provided an opportunity to ask questions and all were answered. The patient agreed with the plan and demonstrated an understanding of the instructions.   The patient was advised to call back or seek an in-person evaluation if the symptoms worsen or if the condition fails to improve as anticipated.  I provided *** minutes of non-face-to-face time during this encounter. Patient located at their place of residence during Mychart visit. Provider is in the office.    [name], NP

## 2021-07-03 NOTE — Telephone Encounter (Signed)
FYI- Pt cancelled his appt for today, not feeling well.

## 2021-07-25 ENCOUNTER — Other Ambulatory Visit: Payer: Self-pay | Admitting: Diagnostic Neuroimaging

## 2021-07-26 ENCOUNTER — Encounter: Payer: Self-pay | Admitting: *Deleted

## 2021-07-26 NOTE — Telephone Encounter (Signed)
Refilled levetiracetam x 3 months. Sent my chart advising he schedule FU or may get through PCP. Asked him to let us know if he does that.

## 2021-08-10 ENCOUNTER — Ambulatory Visit (INDEPENDENT_AMBULATORY_CARE_PROVIDER_SITE_OTHER): Payer: Managed Care, Other (non HMO)

## 2021-08-10 DIAGNOSIS — I495 Sick sinus syndrome: Secondary | ICD-10-CM

## 2021-08-10 LAB — CUP PACEART REMOTE DEVICE CHECK
Date Time Interrogation Session: 20221028074740
Implantable Lead Implant Date: 20141111
Implantable Lead Implant Date: 20141111
Implantable Lead Location: 753859
Implantable Lead Location: 753860
Implantable Lead Model: 350
Implantable Lead Model: 350
Implantable Lead Serial Number: 29446968
Implantable Lead Serial Number: 29497144
Implantable Pulse Generator Implant Date: 20141111
Pulse Gen Serial Number: 68069787

## 2021-08-20 NOTE — Progress Notes (Signed)
Remote pacemaker transmission.   

## 2021-10-19 ENCOUNTER — Encounter: Payer: Managed Care, Other (non HMO) | Admitting: Internal Medicine

## 2021-11-09 ENCOUNTER — Ambulatory Visit (INDEPENDENT_AMBULATORY_CARE_PROVIDER_SITE_OTHER): Payer: Managed Care, Other (non HMO)

## 2021-11-09 DIAGNOSIS — I495 Sick sinus syndrome: Secondary | ICD-10-CM

## 2021-11-09 LAB — CUP PACEART REMOTE DEVICE CHECK
Date Time Interrogation Session: 20230127074208
Implantable Lead Implant Date: 20141111
Implantable Lead Implant Date: 20141111
Implantable Lead Location: 753859
Implantable Lead Location: 753860
Implantable Lead Model: 350
Implantable Lead Model: 350
Implantable Lead Serial Number: 29446968
Implantable Lead Serial Number: 29497144
Implantable Pulse Generator Implant Date: 20141111
Pulse Gen Serial Number: 68069787

## 2021-11-13 ENCOUNTER — Other Ambulatory Visit: Payer: Self-pay

## 2021-11-13 ENCOUNTER — Ambulatory Visit (INDEPENDENT_AMBULATORY_CARE_PROVIDER_SITE_OTHER): Payer: 59 | Admitting: Internal Medicine

## 2021-11-13 ENCOUNTER — Encounter: Payer: Self-pay | Admitting: Internal Medicine

## 2021-11-13 DIAGNOSIS — R001 Bradycardia, unspecified: Secondary | ICD-10-CM | POA: Diagnosis not present

## 2021-11-13 LAB — CUP PACEART INCLINIC DEVICE CHECK
Brady Statistic RA Percent Paced: 72 %
Brady Statistic RV Percent Paced: 0 %
Date Time Interrogation Session: 20230131164708
Implantable Lead Implant Date: 20141111
Implantable Lead Implant Date: 20141111
Implantable Lead Location: 753859
Implantable Lead Location: 753860
Implantable Lead Model: 350
Implantable Lead Model: 350
Implantable Lead Serial Number: 29446968
Implantable Lead Serial Number: 29497144
Implantable Pulse Generator Implant Date: 20141111
Lead Channel Impedance Value: 448 Ohm
Lead Channel Impedance Value: 585 Ohm
Lead Channel Pacing Threshold Amplitude: 0.6 V
Lead Channel Pacing Threshold Amplitude: 0.6 V
Lead Channel Pacing Threshold Amplitude: 0.7 V
Lead Channel Pacing Threshold Amplitude: 0.7 V
Lead Channel Pacing Threshold Pulse Width: 0.4 ms
Lead Channel Pacing Threshold Pulse Width: 0.4 ms
Lead Channel Pacing Threshold Pulse Width: 0.4 ms
Lead Channel Pacing Threshold Pulse Width: 0.4 ms
Lead Channel Sensing Intrinsic Amplitude: 12 mV
Lead Channel Sensing Intrinsic Amplitude: 2.8 mV
Lead Channel Sensing Intrinsic Amplitude: 2.8 mV
Lead Channel Setting Pacing Amplitude: 1.5 V
Lead Channel Setting Pacing Amplitude: 4.2 V
Lead Channel Setting Pacing Pulse Width: 0.4 ms
Pulse Gen Serial Number: 68069787

## 2021-11-13 NOTE — Progress Notes (Signed)
HPI John [name] is referred by Dr. Fawn Kirk for ongoing evaluation and management of his PPM. He is a pleasant 56 yo man with a h/o sinus node dysfunction who underwent PPM insertion 8 years ago. In the interim he has done well. He has a h/o seizures but none in 2 years.  Allergies  Allergen Reactions   Penicillins Hives and Rash   Codeine Nausea And Vomiting   Morphine And Related Nausea And Vomiting     Current Outpatient Medications  Medication Sig Dispense Refill   levETIRAcetam (KEPPRA) 1000 MG tablet Take 1 tablet by mouth twice daily 180 tablet 0   No current facility-administered medications for this visit.     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   Past Surgical History:  Procedure Laterality Date   BACK SURGERY  1992-94   ruptured disc   EXTRACORPOREAL SHOCK WAVE LITHOTRIPSY Right 04/08/2019   Procedure: EXTRACORPOREAL SHOCK WAVE LITHOTRIPSY (ESWL);  Surgeon: Bjorn Pippin, MD;  Location: [facility];  Service: Urology;  Laterality: Right;   LEFT HEART CATHETERIZATION WITH CORONARY ANGIOGRAM N/A 04/08/2014   Procedure: LEFT HEART CATHETERIZATION WITH CORONARY ANGIOGRAM;  Surgeon: Lesleigh Noe, MD;  Location: [facility] CATH LAB;  Service: Cardiovascular;  Laterality: N/A;   LUMBAR DISC SURGERY  1992; 1994   "disc ruptured twice" (08/24/2013)   PACEMAKER INSERTION  08/24/2013   Biotronik dual chamber PPM implanted by Dr Johney Frame   PERMANENT PACEMAKER INSERTION N/A 08/24/2013   Procedure: PERMANENT PACEMAKER INSERTION;  Surgeon: Gardiner Rhyme, MD;  Location: [facility]  CATH LAB;  Service: Cardiovascular;  Laterality: N/A;   TONSILLECTOMY AND ADENOIDECTOMY  ~ 1977     Family History  Problem Relation Age of Onset   Diabetes Father    Diabetes Mother      Social History   Socioeconomic History   Marital status: Married    Spouse name: [name]   Number of children: 2   Years of education: 12   Highest education level: Not on file  Occupational History   Occupation: Administrator, Civil Service    Comment: first shift  Tobacco Use   Smoking status: Never   Smokeless tobacco: Never  Vaping Use   Vaping Use: Never used  Substance and Sexual Activity   Alcohol use: Not Currently    Alcohol/week: 0.0 standard drinks    Comment: 08/24/2013 "maybe couple drinks/year"   Drug use: No   Sexual activity: Yes  Other Topics Concern   Not on file  Social History Narrative   Lives with wife   Social Determinants of Health   Financial Resource Strain: Not on file  Food Insecurity: Not on file  Transportation Needs: Not on file  Physical Activity: Not on file  Stress: Not on file  Social Connections: Not on file  Intimate Partner Violence: Not on file     BP 110/64    Pulse 82    Ht 5\' 8"  (  1.727 m)    Wt 168 lb (76.2 kg)    SpO2 97%    BMI 25.54 kg/m   Physical Exam:  Well appearing NAD HEENT: Unremarkable Neck:  No JVD, no thyromegally Lymphatics:  No adenopathy Back:  No CVA tenderness Lungs:  Clear HEART:  Regular rate rhythm, no murmurs, no rubs, no clicks Abd:  soft, positive bowel sounds, no organomegally, no rebound, no guarding Ext:  2 plus pulses, no edema, no cyanosis, no clubbing Skin:  No rashes no nodules Neuro:  CN II through XII intact, motor grossly intact  EKG - nsr with atrial pacing  DEVICE  Normal device function.  See PaceArt for details.   Assess/Plan:  Sinus node dysfunction - he is asymptomatic s/p PPM insertion. 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 We will recheck in several months. Seizures - he has  not had any in 2 years. His PM is not MRI compatible. He appears to be tolerating Keppra.  [name],MD

## 2021-11-13 NOTE — Patient Instructions (Signed)
Medication Instructions:  Your physician recommends that you continue on your current medications as directed. Please refer to the Current Medication list given to you today.  *If you need a refill on your cardiac medications before your next appointment, please call your pharmacy*   Lab Work: NONE   If you have labs (blood work) drawn today and your tests are completely normal, you will receive your results only by: . MyChart Message (if you have MyChart) OR . A paper copy in the mail If you have any lab test that is abnormal or we need to change your treatment, we will call you to review the results.   Testing/Procedures: NONE    Follow-Up: At [facility], you and your health needs are our priority.  As part of our continuing mission to provide you with exceptional heart care, we have created designated Provider Care Teams.  These Care Teams include your primary Cardiologist (physician) and Advanced Practice Providers (APPs -  Physician Assistants and Nurse Practitioners) who all work together to provide you with the care you need, when you need it.  We recommend signing up for the patient portal called "MyChart".  Sign up information is provided on this After Visit Summary.  MyChart is used to connect with patients for Virtual Visits (Telemedicine).  Patients are able to view lab/test results, encounter notes, upcoming appointments, etc.  Non-urgent messages can be sent to your provider as well.   To learn more about what you can do with MyChart, go to [email or web address].com.    Your next appointment:   1 year(s)  The format for your next appointment:   In Person  Provider:   Gregg Taylor, MD   Other Instructions Thank you for choosing [facility]!    

## 2021-11-19 NOTE — Progress Notes (Signed)
Remote pacemaker transmission.   

## 2022-02-07 LAB — CUP PACEART REMOTE DEVICE CHECK
Battery Remaining Percentage: 40 %
Brady Statistic RA Percent Paced: 68 %
Brady Statistic RV Percent Paced: 0 %
Date Time Interrogation Session: 20230425092413
Implantable Lead Implant Date: 20141111
Implantable Lead Implant Date: 20141111
Implantable Lead Location: 753859
Implantable Lead Location: 753860
Implantable Lead Model: 350
Implantable Lead Model: 350
Implantable Lead Serial Number: 29446968
Implantable Lead Serial Number: 29497144
Implantable Pulse Generator Implant Date: 20141111
Lead Channel Impedance Value: 468 Ohm
Lead Channel Impedance Value: 605 Ohm
Lead Channel Pacing Threshold Amplitude: 0.5 V
Lead Channel Pacing Threshold Pulse Width: 0.4 ms
Lead Channel Sensing Intrinsic Amplitude: 1.7 mV
Lead Channel Sensing Intrinsic Amplitude: 11.9 mV
Lead Channel Setting Pacing Amplitude: 1.5 V
Lead Channel Setting Pacing Amplitude: 4.2 V
Lead Channel Setting Pacing Pulse Width: 0.4 ms
Pulse Gen Serial Number: 68069787

## 2022-02-08 ENCOUNTER — Ambulatory Visit (INDEPENDENT_AMBULATORY_CARE_PROVIDER_SITE_OTHER): Payer: Managed Care, Other (non HMO)

## 2022-02-08 DIAGNOSIS — I495 Sick sinus syndrome: Secondary | ICD-10-CM

## 2022-02-22 NOTE — Progress Notes (Signed)
Remote pacemaker transmission.   

## 2022-05-01 ENCOUNTER — Other Ambulatory Visit: Payer: Self-pay | Admitting: Podiatry

## 2022-05-09 LAB — CUP PACEART REMOTE DEVICE CHECK
Date Time Interrogation Session: 20230726135021
Implantable Lead Implant Date: 20141111
Implantable Lead Implant Date: 20141111
Implantable Lead Location: 753859
Implantable Lead Location: 753860
Implantable Lead Model: 350
Implantable Lead Model: 350
Implantable Lead Serial Number: 29446968
Implantable Lead Serial Number: 29497144
Implantable Pulse Generator Implant Date: 20141111
Pulse Gen Serial Number: 68069787

## 2022-05-10 ENCOUNTER — Ambulatory Visit (INDEPENDENT_AMBULATORY_CARE_PROVIDER_SITE_OTHER): Payer: 59

## 2022-05-10 DIAGNOSIS — I495 Sick sinus syndrome: Secondary | ICD-10-CM | POA: Diagnosis not present

## 2022-05-20 ENCOUNTER — Other Ambulatory Visit (HOSPITAL_COMMUNITY): Payer: 59

## 2022-05-29 NOTE — Progress Notes (Signed)
Remote pacemaker transmission.   

## 2022-05-30 NOTE — Patient Instructions (Addendum)
John [name]  05/30/2022     @PREFPERIOPPHARMACY @   Your procedure is scheduled on 06/05/2022.   Report to 06/07/2022 at  0700  A.M.   Call this number if you have problems the morning of surgery:  [phone]   Remember:  Do not eat or drink after midnight.      Take these medicines the morning of surgery with A SIP OF WATER                                                keppra.     Do not wear jewelry, make-up or nail polish.  Do not wear lotions, powders, or perfumes, or deodorant.  Do not shave 48 hours prior to surgery.  Men may shave face and neck.  Do not bring valuables to the hospital.  [facility] is not responsible for any belongings or valuables.  Contacts, dentures or bridgework may not be worn into surgery.  Leave your suitcase in the car.  After surgery it may be brought to your room.  For patients admitted to the hospital, discharge time will be determined by your treatment team.  Patients discharged the day of surgery will not be allowed to drive home and must have someone with them for 24 hours.    Special instructions:   DO NOT smoke tobacco or vape for 24 hours before your procedure.  Please read over the following fact sheets that you were given. Coughing and Deep Breathing, Surgical Site Infection Prevention, Anesthesia Post-op Instructions, and Care and Recovery After Surgery        Bunion Surgery, Care After This sheet gives you information about how to care for yourself after your procedure. Your health care provider may also give you more specific instructions. If you have problems or questions, contact your health care provider. What can I expect after the procedure? After the procedure, it is common to have: Redness. Pain. Swelling. A small amount of fluid coming from your incision. Follow these instructions at home: If you have a post-operative brace, boot, or shoe: Wear the post-op (post-operative) brace, boot, or shoe  as told by your health care provider. Remove it only as told by your health care provider. Loosen the brace, boot, or shoe if your toes tingle, become numb, or turn cold and blue. Keep the brace, boot, or shoe clean and dry. If you have a cast: Do not put pressure on any part of the cast until it is fully hardened. This may take several hours. Do not stick anything inside the cast to scratch your skin. Doing that increases your risk of infection. Check the skin around the cast every day. Tell your health care provider about any concerns. You may put lotion on dry skin around the edges of the cast. Do not put lotion on the skin underneath the cast. Keep the cast clean and dry. Bathing Do not take baths, swim, or use a hot tub until your health care provider approves. Ask your health care provider if you may take showers. If your brace, boot, shoe, or cast is not waterproof: Do not let it get wet. Cover it with a watertight covering when you take a bath or a shower. Keep your bandage (dressing) dry until your health care provider says it can be removed. Incision care  The dressing holds your toe in the correct position. Do not change the dressing until your health care provider approves. Follow instructions from your health care provider about how to take care of your incision. Make sure you: Wash your hands with soap and water for at least 20 seconds before and after you change your dressing. If soap and water are not available, use hand sanitizer. Change your dressing as told by your health care provider. Leave stitches (sutures), skin glue, or adhesive strips in place. These skin closures may need to stay in place for 2 weeks or longer. If adhesive strip edges start to loosen and curl up, you may trim the loose edges. Do not remove adhesive strips completely unless your health care provider tells you to do that. Check your incision area every day for signs of infection. Check for: More  redness, swelling, or pain. Blood or more fluid. Warmth. Pus or a bad smell. Managing pain, stiffness, and swelling  If directed, put ice on the affected area. To do this: If you have a removable brace, boot, or shoe, remove it as told by your health care provider. Put ice in a plastic bag. Place a towel between your skin and the bag or between your cast and the bag. Leave the ice on for 20 minutes, 2-3 times a day. Remove the ice if your skin turns bright red. This is very important. If you cannot feel pain, heat, or cold, you have a greater risk of damage to the area. Move your toes often to reduce stiffness and swelling. Raise (elevate) the injured area above the level of your heart while you are sitting or lying down. Activity Return to your normal activities as told by your health care provider. Ask your health care provider what activities are safe for you. If physical therapy was prescribed, do exercises as told by your health care provider. Driving If you were given a sedative during the procedure, it can affect you for several hours. Do not drive or operate machinery until your health care provider says that it is safe. Ask your health care provider when it is safe to drive if you have a brace, boot, shoe, or cast on your foot. Safety Do not use the affected leg to support (bear) your body weight until your health care provider says that you can. Follow weight-bearing restrictions as told. Use crutches, a cane, or a walker as told by your health care provider. General instructions Do not use any products that contain nicotine or tobacco, such as cigarettes, e-cigarettes, and chewing tobacco. If you need help quitting, ask your health care provider. Take over-the-counter and prescription medicines only as told by your health care provider. Your medicines may cause constipation. To prevent or treat constipation, you may need to: Drink enough fluid to keep your urine pale  yellow. Take over-the-counter or prescription medicines. Eat foods that are high in fiber, such as beans, whole grains, and fresh fruits and vegetables. Limit foods that are high in fat and processed sugars, such as fried or sweet foods. Do not wear high heels or tight-fitting shoes, even after you heal. Keep all follow-up visits. This is important. Contact a health care provider if: You have any of these signs of infection: More redness, swelling, or pain around your incision. Blood or more fluid coming from your incision. Warmth coming from your incision. Pus or a bad smell coming from your incision. A fever or chills. Your dressing gets wet or it falls  off. You have swelling in your lower leg or calf. You have numbness or stiffness in your toes. Get help right away if: You have severe pain. You have shortness of breath or trouble breathing. You have chest pain. You have redness, swelling, pain, or warmth in your calf or leg. These symptoms may represent a serious problem that is an emergency. Do not wait to see if the symptoms will go away. Get medical help right away. Call your local emergency services (911 in the [place] Do not drive yourself to the hospital. Summary If directed, put ice on the affected area. Leave the ice on for 20 minutes, 2-3 times a day. Ask your health care provider when it is safe to drive if you have a brace, boot, shoe, or cast on your foot. Do not use the affected leg to support (bear) your body weight until your health care provider says that you can. Follow weight-bearing restrictions as told. Use crutches, a cane, or a walker as told by your health care provider. This information is not intended to replace advice given to you by your health care provider. Make sure you discuss any questions you have with your health care provider. Document Revised: 02/04/2020 Document Reviewed: 02/04/2020 Elsevier Patient Education  2023 [employer] Monitored  Anesthesia Care, Care After This sheet gives you information about how to care for yourself after your procedure. Your health care provider may also give you more specific instructions. If you have problems or questions, contact your health care provider. What can I expect after the procedure? After the procedure, it is common to have: Tiredness. Forgetfulness about what happened after the procedure. Impaired judgment for important decisions. Nausea or vomiting. Some difficulty with balance. Follow these instructions at home: For the time period you were told by your health care provider:     Rest as needed. Do not participate in activities where you could fall or become injured. Do not drive or use machinery. Do not drink alcohol. Do not take sleeping pills or medicines that cause drowsiness. Do not make important decisions or sign legal documents. Do not take care of children on your own. Eating and drinking Follow the diet that is recommended by your health care provider. Drink enough fluid to keep your urine pale yellow. If you vomit: Drink water, juice, or soup when you can drink without vomiting. Make sure you have little or no nausea before eating solid foods. General instructions Have a responsible adult stay with you for the time you are told. It is important to have someone help care for you until you are awake and alert. Take over-the-counter and prescription medicines only as told by your health care provider. If you have sleep apnea, surgery and certain medicines can increase your risk for breathing problems. Follow instructions from your health care provider about wearing your sleep device: Anytime you are sleeping, including during daytime naps. While taking prescription pain medicines, sleeping medicines, or medicines that make you drowsy. Avoid smoking. Keep all follow-up visits as told by your health care provider. This is important. Contact a health care provider  if: You keep feeling nauseous or you keep vomiting. You feel light-headed. You are still sleepy or having trouble with balance after 24 hours. You develop a rash. You have a fever. You have redness or swelling around the IV site. Get help right away if: You have trouble breathing. You have new-onset confusion at home. Summary For several hours after your procedure, you may feel  tired. You may also be forgetful and have poor judgment. Have a responsible adult stay with you for the time you are told. It is important to have someone help care for you until you are awake and alert. Rest as told. Do not drive or operate machinery. Do not drink alcohol or take sleeping pills. Get help right away if you have trouble breathing, or if you suddenly become confused. This information is not intended to replace advice given to you by your health care provider. Make sure you discuss any questions you have with your health care provider. Document Revised: 09/04/2021 Document Reviewed: 09/02/2019 Elsevier Patient Education  2023 [employer].

## 2022-05-31 ENCOUNTER — Encounter: Payer: Self-pay | Admitting: Internal Medicine

## 2022-05-31 NOTE — Progress Notes (Signed)
PERIOPERATIVE PRESCRIPTION FOR IMPLANTED CARDIAC DEVICE PROGRAMMING  Patient Information: Name:  John [name]  DOB:  04-11-1966  MRN:  [id number]    Performing Physician: Erskine Emery, DPM Procedure: ARTHRODESIS RIGHT FOURTH TOE/ DEROTATIONAL ARTHROPLASTY OF RIGHT FIFTH TOE Device Information:  Clinic EP Physician:  Lewayne Bunting, MD   Device Type:  Pacemaker Manufacturer and Phone #:  Biotronik: [phone] Pacemaker Dependent?:  No. Date of Last Device Check:  05/08/22 Normal Device Function?:  Yes.    Electrophysiologist's Recommendations:  Have magnet available. Provide continuous ECG monitoring when magnet is used or reprogramming is to be performed.  Procedure should not interfere with device function.  No device programming or magnet placement needed.  Per Device Clinic Standing Orders, Lenor Coffin, RN  2:47 PM 05/31/2022

## 2022-06-03 ENCOUNTER — Encounter (HOSPITAL_COMMUNITY)
Admission: RE | Admit: 2022-06-03 | Discharge: 2022-06-03 | Disposition: A | Discharge status: Home / Self Care | Payer: 59 | Source: Ambulatory Visit | Attending: Podiatry | Admitting: Podiatry

## 2022-06-03 ENCOUNTER — Encounter (HOSPITAL_COMMUNITY): Payer: Self-pay

## 2022-06-03 ENCOUNTER — Ambulatory Visit (HOSPITAL_COMMUNITY)
Admission: RE | Admit: 2022-06-03 | Discharge: 2022-06-03 | Disposition: A | Discharge status: Home / Self Care | Payer: 59 | Source: Ambulatory Visit | Attending: Podiatry | Admitting: Podiatry

## 2022-06-03 ENCOUNTER — Other Ambulatory Visit (HOSPITAL_COMMUNITY): Payer: Self-pay | Admitting: Podiatry

## 2022-06-03 DIAGNOSIS — G8929 Other chronic pain: Secondary | ICD-10-CM

## 2022-06-03 DIAGNOSIS — Z01818 Encounter for other preprocedural examination: Secondary | ICD-10-CM | POA: Insufficient documentation

## 2022-06-03 DIAGNOSIS — M79674 Pain in right toe(s): Secondary | ICD-10-CM | POA: Diagnosis present

## 2022-06-03 HISTORY — DX: Personal history of urinary calculi: Z87.442

## 2022-06-05 ENCOUNTER — Encounter (HOSPITAL_COMMUNITY)
Admission: RE | Disposition: A | Discharge status: Home / Self Care | Payer: Self-pay | Source: Home / Self Care | Attending: Podiatry

## 2022-06-05 ENCOUNTER — Encounter (HOSPITAL_COMMUNITY): Payer: Self-pay

## 2022-06-05 ENCOUNTER — Ambulatory Visit (HOSPITAL_COMMUNITY)
Admission: RE | Admit: 2022-06-05 | Discharge: 2022-06-05 | Disposition: A | Discharge status: Home / Self Care | Payer: 59 | Attending: Podiatry | Admitting: Podiatry

## 2022-06-05 ENCOUNTER — Ambulatory Visit (HOSPITAL_COMMUNITY): Payer: 59 | Admitting: Anesthesiology

## 2022-06-05 ENCOUNTER — Other Ambulatory Visit: Payer: Self-pay

## 2022-06-05 ENCOUNTER — Ambulatory Visit (HOSPITAL_COMMUNITY): Payer: 59

## 2022-06-05 ENCOUNTER — Ambulatory Visit (HOSPITAL_BASED_OUTPATIENT_CLINIC_OR_DEPARTMENT_OTHER): Payer: 59 | Admitting: Anesthesiology

## 2022-06-05 DIAGNOSIS — Z950 Presence of cardiac pacemaker: Secondary | ICD-10-CM | POA: Diagnosis not present

## 2022-06-05 DIAGNOSIS — M2041 Other hammer toe(s) (acquired), right foot: Secondary | ICD-10-CM | POA: Diagnosis present

## 2022-06-05 DIAGNOSIS — M205X1 Other deformities of toe(s) (acquired), right foot: Secondary | ICD-10-CM

## 2022-06-05 DIAGNOSIS — Z9889 Other specified postprocedural states: Secondary | ICD-10-CM

## 2022-06-05 HISTORY — PX: TOE ARTHROPLASTY: SHX6504

## 2022-06-05 HISTORY — PX: FLEXOR TENOTOMY: SHX6342

## 2022-06-05 SURGERY — TENOTOMY, FLEXOR
Anesthesia: General | Site: Toe | Laterality: Right

## 2022-06-05 MED ORDER — ONDANSETRON HCL 4 MG/2ML IJ SOLN
4.0000 mg | Freq: Once | INTRAMUSCULAR | Status: AC | PRN
  Administered 2022-06-05: 4 mg via INTRAVENOUS

## 2022-06-05 MED ORDER — FENTANYL CITRATE PF 50 MCG/ML IJ SOSY: 25.0000 ug | PREFILLED_SYRINGE | INTRAMUSCULAR | Status: DC | PRN

## 2022-06-05 MED ORDER — MIDAZOLAM HCL 2 MG/2ML IJ SOLN
INTRAMUSCULAR | Status: AC
  Filled 2022-06-05: qty 2

## 2022-06-05 MED ORDER — LIDOCAINE HCL (PF) 1 % IJ SOLN
INTRAMUSCULAR | Status: AC
  Filled 2022-06-05: qty 60

## 2022-06-05 MED ORDER — PROPOFOL 10 MG/ML IV BOLUS
INTRAVENOUS | Status: DC | PRN
  Administered 2022-06-05: 50 mg via INTRAVENOUS

## 2022-06-05 MED ORDER — OXYCODONE HCL 5 MG/5ML PO SOLN: 5.0000 mg | Freq: Once | ORAL | Status: DC | PRN

## 2022-06-05 MED ORDER — PROPOFOL 500 MG/50ML IV EMUL
INTRAVENOUS | Status: DC | PRN
  Administered 2022-06-05: 150 ug/kg/min via INTRAVENOUS

## 2022-06-05 MED ORDER — CLINDAMYCIN PHOSPHATE 600 MG/50ML IV SOLN
INTRAVENOUS | Status: DC | PRN
  Administered 2022-06-05: 600 mg via INTRAVENOUS

## 2022-06-05 MED ORDER — LIDOCAINE HCL 1 % IJ SOLN
INTRAMUSCULAR | Status: DC | PRN
  Administered 2022-06-05: 8 mL via INTRADERMAL

## 2022-06-05 MED ORDER — ORAL CARE MOUTH RINSE: 15.0000 mL | Freq: Once | OROMUCOSAL | Status: AC

## 2022-06-05 MED ORDER — BUPIVACAINE HCL (PF) 0.5 % IJ SOLN
INTRAMUSCULAR | Status: AC
  Filled 2022-06-05: qty 60

## 2022-06-05 MED ORDER — 0.9 % SODIUM CHLORIDE (POUR BTL) OPTIME
TOPICAL | Status: DC | PRN
  Administered 2022-06-05: 1000 mL

## 2022-06-05 MED ORDER — CHLORHEXIDINE GLUCONATE 0.12 % MT SOLN
15.0000 mL | Freq: Once | OROMUCOSAL | Status: AC
  Administered 2022-06-05: 15 mL via OROMUCOSAL

## 2022-06-05 MED ORDER — MIDAZOLAM HCL 5 MG/5ML IJ SOLN
INTRAMUSCULAR | Status: DC | PRN
  Administered 2022-06-05: 2 mg via INTRAVENOUS

## 2022-06-05 MED ORDER — OXYCODONE HCL 5 MG PO TABS: 5.0000 mg | ORAL_TABLET | Freq: Once | ORAL | Status: DC | PRN

## 2022-06-05 MED ORDER — FENTANYL CITRATE (PF) 100 MCG/2ML IJ SOLN
INTRAMUSCULAR | Status: AC
  Filled 2022-06-05: qty 2

## 2022-06-05 MED ORDER — VANCOMYCIN HCL 1500 MG/300ML IV SOLN
1500.0000 mg | Freq: Once | INTRAVENOUS | Status: DC
  Filled 2022-06-05: qty 300

## 2022-06-05 MED ORDER — PROPOFOL 10 MG/ML IV BOLUS
INTRAVENOUS | Status: AC
  Filled 2022-06-05: qty 20

## 2022-06-05 MED ORDER — LACTATED RINGERS IV SOLN: INTRAVENOUS | Status: DC

## 2022-06-05 MED ORDER — FENTANYL CITRATE (PF) 100 MCG/2ML IJ SOLN
INTRAMUSCULAR | Status: DC | PRN
  Administered 2022-06-05: 50 ug via INTRAVENOUS

## 2022-06-05 SURGICAL SUPPLY — 46 items
APL PRP STRL LF DISP 70% ISPRP (MISCELLANEOUS) ×1
APL SKNCLS STERI-STRIP NONHPOA (GAUZE/BANDAGES/DRESSINGS) ×1
BANDAGE ESMARK 4X12 BL STRL LF (DISPOSABLE) ×1 IMPLANT
BENZOIN TINCTURE PRP APPL 2/3 (GAUZE/BANDAGES/DRESSINGS) ×1 IMPLANT
BLADE AVERAGE 25X9 (BLADE) ×1 IMPLANT
BLADE OSC/SAG 11.5X5.5X.38 (BLADE) ×1 IMPLANT
BLADE SURG 15 STRL LF DISP TIS (BLADE) ×2 IMPLANT
BLADE SURG 15 STRL SS (BLADE) ×2
BNDG CMPR 12X4 ELC STRL LF (DISPOSABLE) ×1
BNDG CMPR STD VLCR NS LF 5.8X3 (GAUZE/BANDAGES/DRESSINGS) ×1
BNDG CONFORM 2 STRL LF (GAUZE/BANDAGES/DRESSINGS) ×1 IMPLANT
BNDG ELASTIC 3X5.8 VLCR NS LF (GAUZE/BANDAGES/DRESSINGS) IMPLANT
BNDG ESMARK 4X12 BLUE STRL LF (DISPOSABLE) ×1
BNDG GAUZE ELAST 4 BULKY (GAUZE/BANDAGES/DRESSINGS) ×1 IMPLANT
CHLORAPREP W/TINT 26 (MISCELLANEOUS) ×1 IMPLANT
CLOTH BEACON ORANGE TIMEOUT ST (SAFETY) ×1 IMPLANT
COVER LIGHT HANDLE STERIS (MISCELLANEOUS) ×2 IMPLANT
CUFF TOURN SGL QUICK 18X4 (TOURNIQUET CUFF) ×1 IMPLANT
DECANTER SPIKE VIAL GLASS SM (MISCELLANEOUS) ×2 IMPLANT
DRAPE C-ARM FOLDED MOBILE STRL (DRAPES) IMPLANT
DRSG ADAPTIC 3X8 NADH LF (GAUZE/BANDAGES/DRESSINGS) ×1 IMPLANT
ELECT REM PT RETURN 9FT ADLT (ELECTROSURGICAL) ×1
ELECTRODE REM PT RTRN 9FT ADLT (ELECTROSURGICAL) ×1 IMPLANT
GAUZE SPONGE 4X4 12PLY STRL (GAUZE/BANDAGES/DRESSINGS) ×1 IMPLANT
GLOVE BIO SURGEON STRL SZ7 (GLOVE) IMPLANT
GLOVE BIOGEL PI IND STRL 7.0 (GLOVE) ×2 IMPLANT
GLOVE BIOGEL PI IND STRL 7.5 (GLOVE) ×1 IMPLANT
GLOVE BIOGEL PI INDICATOR 7.0 (GLOVE) ×3
GLOVE BIOGEL PI INDICATOR 7.5 (GLOVE) ×1
GLOVE ECLIPSE 7.0 STRL STRAW (GLOVE) IMPLANT
GLOVE SURG SS PI 7.0 STRL IVOR (GLOVE) IMPLANT
GOWN STRL REUS W/ TWL LRG LVL3 (GOWN DISPOSABLE) ×1 IMPLANT
GOWN STRL REUS W/TWL LRG LVL3 (GOWN DISPOSABLE) ×3 IMPLANT
KIT TURNOVER KIT A (KITS) ×1 IMPLANT
MANIFOLD NEPTUNE II (INSTRUMENTS) ×1 IMPLANT
NDL HYPO 25X1 1.5 SAFETY (NEEDLE) ×4 IMPLANT
NEEDLE HYPO 25X1 1.5 SAFETY (NEEDLE) ×2 IMPLANT
NS IRRIG 1000ML POUR BTL (IV SOLUTION) ×1 IMPLANT
PACK BASIC LIMB (CUSTOM PROCEDURE TRAY) ×1 IMPLANT
PAD ARMBOARD 7.5X6 YLW CONV (MISCELLANEOUS) ×1 IMPLANT
SET BASIN LINEN APH (SET/KITS/TRAYS/PACK) ×1 IMPLANT
STRIP CLOSURE SKIN 1/2X4 (GAUZE/BANDAGES/DRESSINGS) ×1 IMPLANT
SUT PROLENE 4 0 PS 2 18 (SUTURE) ×1 IMPLANT
SUT VIC AB 2-0 CT2 27 (SUTURE) IMPLANT
SUT VICRYL AB 3-0 FS1 BRD 27IN (SUTURE) ×1 IMPLANT
SYR CONTROL 10ML LL (SYRINGE) ×2 IMPLANT

## 2022-06-05 NOTE — Brief Op Note (Signed)
06/05/2022  9:21 AM  PATIENT:  John [name]  56 y.o. male  PRE-OPERATIVE DIAGNOSIS:  RIGHT UNDERLAPPING FOURTH HAMMER TOE, ADDUCTOVARUS DEFORMITY OF RIGHT FITH TOE  POST-OPERATIVE DIAGNOSIS:  RIGHT UNDERLAPPING FOURTH HAMMER TOE, ADDUCTOVARUS DEFORMITY OF RIGHT FITH TOE  PROCEDURE:  Procedure(s): FLEXOR TENOTOMY OF RIGHT FOURTH TOE (Right) ARTHROPLASTY OF RIGHT FIFTH TOE (Right)  SURGEON:  Surgeon(s) and Role:    * Tyson Babinski, DPM - Primary  PHYSICIAN ASSISTANT:   ASSISTANTS: none   ANESTHESIA:   local and MAC  EBL:  None.   BLOOD ADMINISTERED:none  DRAINS: none   LOCAL MEDICATIONS USED:  MARCAINE   , LIDOCAINE , and Amount: 8 ml  SPECIMEN:  No Specimen  DISPOSITION OF SPECIMEN:  N/A  COUNTS:  YES  TOURNIQUET:   Total Tourniquet Time Documented: Ankle (Right) - 32 minutes Total: Ankle (Right) - 32 minutes   DICTATION: .Viviann Spare Dictation  PLAN OF CARE: Discharge to home after PACU  PATIENT DISPOSITION:  PACU - hemodynamically stable.   Delay start of Pharmacological VTE agent (>24hrs) due to surgical blood loss or risk of bleeding: not applicable

## 2022-06-05 NOTE — Op Note (Signed)
06/05/2022  9:21 AM  PATIENT:  John [name]  56 y.o. male  PRE-OPERATIVE DIAGNOSIS:  RIGHT UNDERLAPPING FOURTH HAMMER TOE, ADDUCTOVARUS DEFORMITY OF RIGHT FITH TOE  POST-OPERATIVE DIAGNOSIS:  RIGHT UNDERLAPPING FOURTH HAMMER TOE, ADDUCTOVARUS DEFORMITY OF RIGHT FITH TOE  PROCEDURE:  Procedure(s): FLEXOR TENOTOMY OF RIGHT FOURTH TOE (Right) ARTHROPLASTY OF RIGHT FIFTH TOE (Right)  SURGEON:  Surgeon(s) and Role:    * Tyson Babinski, DPM - Primary  PHYSICIAN ASSISTANT:   ASSISTANTS: none   ANESTHESIA:   local and MAC  EBL:  none   BLOOD ADMINISTERED:none  LOCAL MEDICATIONS USED:  MARCAINE   , LIDOCAINE , and Amount: 8 ml  SPECIMEN:  No Specimen  TOURNIQUET:   Total Tourniquet Time Documented: Ankle (Right) - 32 minutes Total: Ankle (Right) - 32 minutes  Patient was brought into the operating room laid supine on the operating table. Ankle tourniquet was applied to the surgical extremity. Following IV sedation, a local block was achieved using 8 cc of mixture of 1% plain lidocaine with 0.5% marcaine. The foot was the prepped, scrubbed and draped in aseptic manner. Using an esmarch band the tourniquet on the surgical site was inflatted at 285mHG.    Attention was directed towards the right fourth toe. The toe is under lapping the right third toe. Percutaneous flexor tenotomy was performed at the right fourth DIPJ. The toe was in better alignment compared to preop. Decision was made not to do arthrodesis at the PIPJ. The incision on the plantar aspect was closed using 4-0 Prolene.   Attention was directed towards the right fifth toe. There is adductovarus deformity of the fifth toe noted. A semi-elliptical incision was made from distal dorsal medial to plantar proximal lateral direction. The incision was carried down to subcutaneous tissue. Extensor tendon tenotomy was performed to expose the head of the proximal phalanx. Using sagittal saw, the head was resection. No sharp  edges felt. The surgical site was irrigated with normal saline. The tendon was repaired back using a 2-0 Vicryl. The skin was closed using 4-0 Prolene.   Dry sterile dressing applied. The tourniquet was deflated. Capillary refill time was brisk to lesser toes.    Attention was

## 2022-06-05 NOTE — Anesthesia Postprocedure Evaluation (Signed)
Anesthesia Post Note  Patient: John [name]  Procedure(s) Performed: FLEXOR TENOTOMY OF RIGHT FOURTH TOE (Right: Toe) ARTHROPLASTY OF RIGHT FIFTH TOE (Right: Toe)  Patient location during evaluation: Phase II Anesthesia Type: General Level of consciousness: awake Pain management: pain level controlled Vital Signs Assessment: post-procedure vital signs reviewed and stable Respiratory status: spontaneous breathing and respiratory function stable Cardiovascular status: blood pressure returned to baseline and stable Postop Assessment: no headache and no apparent nausea or vomiting Anesthetic complications: no Comments: Late entry   No notable events documented.   Last Vitals:  Vitals:   06/05/22 0955 06/05/22 1002  BP: 110/74 108/70  Pulse: 65 63  Resp: 18 18  Temp:  36.4 C  SpO2: 99% 97%    Last Pain:  Vitals:   06/05/22 1002  TempSrc: Oral  PainSc: 0-No pain                 Windell Norfolk

## 2022-06-05 NOTE — H&P (Signed)
.  HISTORY AND PHYSICAL INTERVAL NOTE:  06/05/2022  8:10 AM  John [name]  has presented today for surgery, with the diagnosis of RIGHT UNDERLAPPING FOURTH HAMMER TOE, ADDUCTOVARUS DEFORMITY OF RIGHT FITH TOE.  The various methods of treatment have been discussed with the patient.  No guarantees were given.  After consideration of risks, benefits and other options for treatment, the patient has consented to surgery.  I have reviewed the patients' chart and labs.    Patient Vitals for the past 24 hrs:  BP Temp Temp src Pulse Resp SpO2  06/05/22 0738 113/74 97.7 F (36.5 C) Oral 73 18 96 %    A history and physical examination was performed in my office.  The patient was reexamined.  There have been no changes to this history and physical examination.  Erskine Emery, DPM

## 2022-06-05 NOTE — Anesthesia Preprocedure Evaluation (Signed)
Anesthesia Evaluation  Patient identified by MRN, date of birth, ID band Patient awake    Reviewed: Allergy & Precautions, H&P , NPO status , Patient's Chart, lab work & pertinent test results, reviewed documented beta blocker date and time   Airway Mallampati: II  TM Distance: >3 FB Neck ROM: full    Dental no notable dental hx.    Pulmonary neg pulmonary ROS,    Pulmonary exam normal breath sounds clear to auscultation       Cardiovascular Exercise Tolerance: Good + pacemaker  Rhythm:regular Rate:Normal     Neuro/Psych Seizures -,  negative psych ROS   GI/Hepatic negative GI ROS, Neg liver ROS,   Endo/Other  negative endocrine ROS  Renal/GU negative Renal ROS  negative genitourinary   Musculoskeletal   Abdominal   Peds  Hematology negative hematology ROS (+)   Anesthesia Other Findings   Reproductive/Obstetrics negative OB ROS                             Anesthesia Physical Anesthesia Plan  ASA: 3  Anesthesia Plan: General   Post-op Pain Management:    Induction:   PONV Risk Score and Plan: Propofol infusion  Airway Management Planned:   Additional Equipment:   Intra-op Plan:   Post-operative Plan:   Informed Consent: I have reviewed the patients History and Physical, chart, labs and discussed the procedure including the risks, benefits and alternatives for the proposed anesthesia with the patient or authorized representative who has indicated his/her understanding and acceptance.     Dental Advisory Given  Plan Discussed with: CRNA  Anesthesia Plan Comments:         Anesthesia Quick Evaluation

## 2022-06-05 NOTE — Discharge Instructions (Signed)

## 2022-06-05 NOTE — Transfer of Care (Signed)
Immediate Anesthesia Transfer of Care Note  Patient: Fermon Ureta Cutler  Procedure(s) Performed: FLEXOR TENOTOMY OF RIGHT FOURTH TOE (Right: Toe) ARTHROPLASTY OF RIGHT FIFTH TOE (Right: Toe)  Patient Location: PACU  Anesthesia Type:General  Level of Consciousness: awake  Airway & Oxygen Therapy: Patient Spontanous Breathing  Post-op Assessment: Report given to RN  Post vital signs: Reviewed and stable  Last Vitals:  Vitals Value Taken Time  BP 97/72 06/05/22 0930  Temp    Pulse 72 06/05/22 0932  Resp 13 06/05/22 0932  SpO2 98 % 06/05/22 0932  Vitals shown include unvalidated device data.  Last Pain:  Vitals:   06/05/22 0738  TempSrc: Oral  PainSc: 8       Patients Stated Pain Goal: 8 (06/05/22 7741)  Complications: No notable events documented.

## 2022-06-11 ENCOUNTER — Encounter (HOSPITAL_COMMUNITY): Payer: Self-pay | Admitting: Podiatry

## 2022-08-09 ENCOUNTER — Ambulatory Visit (INDEPENDENT_AMBULATORY_CARE_PROVIDER_SITE_OTHER): Payer: 59

## 2022-08-09 ENCOUNTER — Telehealth: Payer: Self-pay | Admitting: Internal Medicine

## 2022-08-09 DIAGNOSIS — I495 Sick sinus syndrome: Secondary | ICD-10-CM | POA: Diagnosis not present

## 2022-08-09 LAB — CUP PACEART REMOTE DEVICE CHECK
Date Time Interrogation Session: 20231027072711
Implantable Lead Connection Status: 753985
Implantable Lead Connection Status: 753985
Implantable Lead Implant Date: 20141111
Implantable Lead Implant Date: 20141111
Implantable Lead Location: 753859
Implantable Lead Location: 753860
Implantable Lead Model: 350
Implantable Lead Model: 350
Implantable Lead Serial Number: 29446968
Implantable Lead Serial Number: 29497144
Implantable Pulse Generator Implant Date: 20141111
Pulse Gen Serial Number: 68069787

## 2022-08-09 NOTE — Telephone Encounter (Signed)
Pt came in the office and stated that he has been seeing a dr at [facility] for a pain in his neck. He stated that the doctor there, Dr. Arnoldo Morale would like for him to have an MRI or myelogram. Pt stated that he could not tolerate the myelogram in the past and would prefer to do an MRI. Dr. Arnoldo Morale would like to know if he can do the MRI since he has a pacemaker.

## 2022-08-09 NOTE — Telephone Encounter (Signed)
Unfortunately, his BIOTRONIK PPM is an [place] which is not MRI compatible.  He cannot have an MRI.  I did confirm this with industry rep, Portia as well.    Patient made aware and thanks me for the call and follow up.

## 2022-08-14 NOTE — Progress Notes (Signed)
Remote pacemaker transmission.   

## 2022-08-21 ENCOUNTER — Other Ambulatory Visit: Payer: Self-pay | Admitting: Neurosurgery

## 2022-08-21 DIAGNOSIS — M542 Cervicalgia: Secondary | ICD-10-CM

## 2022-11-08 ENCOUNTER — Ambulatory Visit: Payer: 59 | Attending: Cardiovascular Disease

## 2022-11-08 DIAGNOSIS — I495 Sick sinus syndrome: Secondary | ICD-10-CM | POA: Diagnosis not present

## 2022-11-08 LAB — CUP PACEART REMOTE DEVICE CHECK
Battery Voltage: 40
Date Time Interrogation Session: 20240126093838
Implantable Lead Connection Status: 753985
Implantable Lead Connection Status: 753985
Implantable Lead Implant Date: 20141111
Implantable Lead Implant Date: 20141111
Implantable Lead Location: 753859
Implantable Lead Location: 753860
Implantable Lead Model: 350
Implantable Lead Model: 350
Implantable Lead Serial Number: 29446968
Implantable Lead Serial Number: 29497144
Implantable Pulse Generator Implant Date: 20141111
Pulse Gen Serial Number: 68069787

## 2022-11-21 NOTE — Progress Notes (Deleted)
    Electrophysiology Office Note Date: 11/21/2022  ID:  John [name], DOB 02-06-1966, MRN [id number]  PCP: Manon Hilding, MD Primary Cardiologist: None Electrophysiologist: Dr. Lovena Le ->  John Peru, MD   CC: Pacemaker follow-up  John [name] is a 57 y.o. male seen today for John Peru, MD for routine electrophysiology followup. Since last being seen in our clinic the patient reports doing ***.  he denies chest pain, palpitations, dyspnea, PND, orthopnea, nausea, vomiting, dizziness, syncope, edema, weight gain, or early satiety.   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   GEN- NAD. A&O x 3. Normal affect HEENT: Normocephalic, atraumatic Lungs- CTAB, Normal effort.  Heart- {EPRHYTHM:28826} rate and rhythm. No M/G/R.  Extremities- {EDEMA LEVEL:28147::"No"} peripheral edema. no clubbing or cyanosis Skin- warm and dry, no rash or lesion, PPM pocket well healed.  PPM Interrogation-  reviewed in detail today,  See PACEART report.  {IBBCWUGQ:91694::"HWT is not ordered today"}  Other studies Reviewed: Additional studies/ records that were reviewed today include: Previous EP office notes, Previous remote checks, Most recent labwork.   {Select studies to display:26339}  Assessment and Plan:  1. SND s/p Biotronik PPM  Normal PPM  function See Pace Art report No changes today  2. H/o Seizures Overall stable on Keppra.  His device is NOT MRI compatible   Current medicines are reviewed at length with the patient today.    Labs/ tests ordered today include: *** No orders of the defined types were placed in this encounter.    Disposition:   Follow up with {EPPROVIDERS:28135} {EPFOLLOW UP:28173}   Signed, Shirley Friar, PA-C  11/21/2022 12:55 PM  [address] [place] [id number] [phone] (office) [phone] (fax)

## 2022-11-22 ENCOUNTER — Encounter: Payer: 59 | Admitting: Student

## 2022-11-22 DIAGNOSIS — I495 Sick sinus syndrome: Secondary | ICD-10-CM

## 2022-11-22 DIAGNOSIS — R001 Bradycardia, unspecified: Secondary | ICD-10-CM

## 2022-11-25 NOTE — Progress Notes (Signed)
Remote pacemaker transmission.   

## 2022-12-10 ENCOUNTER — Encounter: Payer: Self-pay | Admitting: Internal Medicine

## 2022-12-10 ENCOUNTER — Ambulatory Visit: Payer: 59 | Attending: Student | Admitting: Internal Medicine

## 2022-12-10 VITALS — BP 124/72 | HR 79 | Ht 69.0 in | Wt 167.0 lb

## 2022-12-10 DIAGNOSIS — R001 Bradycardia, unspecified: Secondary | ICD-10-CM

## 2022-12-10 LAB — CUP PACEART INCLINIC DEVICE CHECK
Battery Remaining Longevity: 40
Brady Statistic RA Percent Paced: 70 %
Brady Statistic RV Percent Paced: 0 %
Date Time Interrogation Session: 20240227193501
Implantable Lead Connection Status: 753985
Implantable Lead Connection Status: 753985
Implantable Lead Implant Date: 20141111
Implantable Lead Implant Date: 20141111
Implantable Lead Location: 753859
Implantable Lead Location: 753860
Implantable Lead Model: 350
Implantable Lead Model: 350
Implantable Lead Serial Number: 29446968
Implantable Lead Serial Number: 29497144
Implantable Pulse Generator Implant Date: 20141111
Lead Channel Impedance Value: 468 Ohm
Lead Channel Impedance Value: 624 Ohm
Lead Channel Pacing Threshold Amplitude: 0.5 V
Lead Channel Pacing Threshold Amplitude: 0.8 V
Lead Channel Pacing Threshold Pulse Width: 0.4 ms
Lead Channel Pacing Threshold Pulse Width: 0.4 ms
Lead Channel Sensing Intrinsic Amplitude: 11.9 mV
Lead Channel Sensing Intrinsic Amplitude: 2.8 mV
Pulse Gen Serial Number: 68069787

## 2022-12-10 NOTE — Progress Notes (Signed)
HPI John [name] returns today for ongoing evaluation and management of his PPM and sinus node dysfunction. John [name] He is a pleasant 57 yo man with a h/o sinus node dysfunction who underwent PPM insertion 9 years ago. In the interim he has done well. He has a h/o seizures but none in 3 years.  He is working over 50 hours a week, 6 days at a time. He has no limit to his activity.  Allergies  Allergen Reactions   Penicillins Hives and Rash   Morphine Nausea And Vomiting   Codeine Nausea And Vomiting   Morphine And Related Nausea And Vomiting     No current outpatient medications on file.   No current facility-administered medications for this visit.     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   Past Surgical History:  Procedure Laterality Date   BACK SURGERY  1992-94   ruptured disc   EXTRACORPOREAL SHOCK WAVE LITHOTRIPSY Right 04/08/2019   Procedure: EXTRACORPOREAL SHOCK WAVE LITHOTRIPSY (ESWL);  Surgeon: Irine Seal, MD;  Location: [facility];  Service: Urology;  Laterality: Right;   FLEXOR TENOTOMY  Right 06/05/2022   Procedure: FLEXOR TENOTOMY OF RIGHT FOURTH TOE;  Surgeon: Tyson Babinski, DPM;  Location: [facility];  Service: Podiatry;  Laterality: Right;   LEFT HEART CATHETERIZATION WITH CORONARY ANGIOGRAM N/A 04/08/2014   Procedure: LEFT HEART CATHETERIZATION WITH CORONARY ANGIOGRAM;  Surgeon: Sinclair Grooms, MD;  Location: [facility] CATH LAB;  Service: Cardiovascular;  Laterality: N/A;   [place]; 1994   "disc ruptured  twice" (08/24/2013)   PACEMAKER INSERTION  08/24/2013   Biotronik dual chamber PPM implanted by Dr Rayann Heman   PERMANENT PACEMAKER INSERTION N/A 08/24/2013   Procedure: PERMANENT PACEMAKER INSERTION;  Surgeon: Coralyn Mark, MD;  Location: [facility] CATH LAB;  Service: Cardiovascular;  Laterality: N/A;   TOE ARTHROPLASTY Right 06/05/2022   Procedure: ARTHROPLASTY OF RIGHT FIFTH TOE;  Surgeon: Tyson Babinski, DPM;  Location: [facility];  Service: Podiatry;  Laterality: Right;   TONSILLECTOMY AND ADENOIDECTOMY  ~ 1977     Family History  Problem Relation Age of Onset   Diabetes Father    Diabetes Mother      Social History   Socioeconomic History   Marital status: Married    Spouse name: John [name]   Number of children: 2   Years of education: 12   Highest education level: Not on file  Occupational History   Occupation: Associate Professor    Comment: first shift  Tobacco Use   Smoking status: Never   Smokeless tobacco: Never  Vaping Use   Vaping Use: Never used  Substance and Sexual Activity   Alcohol use: Not Currently    Alcohol/week: 0.0 standard drinks of alcohol    Comment: 08/24/2013 "maybe couple drinks/year"   Drug use: No   Sexual activity: Yes  Other Topics Concern  Not on file  Social History Narrative   Lives with wife   Social Determinants of Health   Financial Resource Strain: Not on file  Food Insecurity: Not on file  Transportation Needs: Not on file  Physical Activity: Not on file  Stress: Not on file  Social Connections: Not on file  Intimate Partner Violence: Not on file     BP 124/72   Pulse 79   Ht '5\' 9"'$  (1.753 m)   Wt 167 lb (75.8 kg)   SpO2 97%   BMI 24.66 kg/m   Physical Exam:  Well appearing NAD HEENT: Unremarkable Neck:  No JVD, no thyromegally Lymphatics:  No adenopathy Back:  No CVA tenderness Lungs:  Clear HEART:  Regular rate rhythm, no murmurs, no rubs, no clicks Abd:  soft, positive bowel sounds, no organomegally, no rebound,  no guarding Ext:  2 plus pulses, no edema, no cyanosis, no clubbing Skin:  No rashes no nodules Neuro:  CN II through XII intact, motor grossly intact  EKG - nsr with atrial pacing  DEVICE  Normal device function.  See PaceArt for details.   Assess/Plan:  Sinus node dysfunction - he is asymptomatic s/p PPM insertion. 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 We will recheck in several months. Seizures - he has not had any in 3 years. His PM is not MRI compatible. He appears to have had his Keppra stopped.   Carleene Overlie Skippy [place],MD

## 2022-12-10 NOTE — Patient Instructions (Signed)
Medication Instructions:  Your physician recommends that you continue on your current medications as directed. Please refer to the Current Medication list given to you today.  *If you need a refill on your cardiac medications before your next appointment, please call your pharmacy*   Lab Work: NONE   If you have labs (blood work) drawn today and your tests are completely normal, you will receive your results only by: [place] (if you have MyChart) OR A paper copy in the mail If you have any lab test that is abnormal or we need to change your treatment, we will call you to review the results.   Testing/Procedures: NONE    Follow-Up: At [facility], you and your health needs are our priority.  As part of our continuing mission to provide you with exceptional heart care, we have created designated Provider Care Teams.  These Care Teams include your primary Cardiologist (physician) and Advanced Practice Providers (APPs -  Physician Assistants and Nurse Practitioners) who all work together to provide you with the care you need, when you need it.  We recommend signing up for the patient portal called "MyChart".  Sign up information is provided on this After Visit Summary.  MyChart is used to connect with patients for Virtual Visits (Telemedicine).  Patients are able to view lab/test results, encounter notes, upcoming appointments, etc.  Non-urgent messages can be sent to your provider as well.   To learn more about what you can do with MyChart, go to [email or web address].    Your next appointment:   1 year(s)  Provider:   [name], MD    Other Instructions Thank you for choosing [facility]!

## 2023-02-07 ENCOUNTER — Ambulatory Visit (INDEPENDENT_AMBULATORY_CARE_PROVIDER_SITE_OTHER): Payer: 59

## 2023-02-07 DIAGNOSIS — I495 Sick sinus syndrome: Secondary | ICD-10-CM

## 2023-02-07 LAB — CUP PACEART REMOTE DEVICE CHECK
Battery Voltage: 40
Date Time Interrogation Session: 20240426062750
Implantable Lead Connection Status: 753985
Implantable Lead Connection Status: 753985
Implantable Lead Implant Date: 20141111
Implantable Lead Implant Date: 20141111
Implantable Lead Location: 753859
Implantable Lead Location: 753860
Implantable Lead Model: 350
Implantable Lead Model: 350
Implantable Lead Serial Number: 29446968
Implantable Lead Serial Number: 29497144
Implantable Pulse Generator Implant Date: 20141111
Pulse Gen Serial Number: 68069787

## 2023-03-04 NOTE — Progress Notes (Signed)
Remote pacemaker transmission.   

## 2023-05-09 ENCOUNTER — Ambulatory Visit (INDEPENDENT_AMBULATORY_CARE_PROVIDER_SITE_OTHER): Payer: Managed Care, Other (non HMO)

## 2023-05-09 DIAGNOSIS — I495 Sick sinus syndrome: Secondary | ICD-10-CM

## 2023-05-09 LAB — CUP PACEART REMOTE DEVICE CHECK
Battery Voltage: 40
Date Time Interrogation Session: 20240726073345
Implantable Lead Connection Status: 753985
Implantable Lead Connection Status: 753985
Implantable Lead Implant Date: 20141111
Implantable Lead Implant Date: 20141111
Implantable Lead Location: 753859
Implantable Lead Location: 753860
Implantable Lead Model: 350
Implantable Lead Model: 350
Implantable Lead Serial Number: 29446968
Implantable Lead Serial Number: 29497144
Implantable Pulse Generator Implant Date: 20141111
Pulse Gen Serial Number: 68069787

## 2023-05-16 NOTE — Progress Notes (Signed)
Remote pacemaker transmission.   

## 2023-07-01 ENCOUNTER — Encounter (INDEPENDENT_AMBULATORY_CARE_PROVIDER_SITE_OTHER): Payer: Self-pay | Admitting: *Deleted

## 2023-07-18 ENCOUNTER — Telehealth (INDEPENDENT_AMBULATORY_CARE_PROVIDER_SITE_OTHER): Payer: Self-pay | Admitting: Gastroenterology

## 2023-07-18 NOTE — Telephone Encounter (Signed)
Who is your primary care physician: Dr.Sasser  Reasons for the colonoscopy: screening  Have you had a colonoscopy before?  no  Do you have family history of colon cancer? no  Previous colonoscopy with polyps removed? no  Do you have a history colorectal cancer?   no  Are you diabetic? If yes, Type 1 or Type 2?    no  Do you have a prosthetic or mechanical heart valve? no  Do you have a pacemaker/defibrillator?   yes  Have you had endocarditis/atrial fibrillation? no  Have you had joint replacement within the last 12 months?  no  Do you tend to be constipated or have to use laxatives? no  Do you have any history of drugs or alchohol?  no  Do you use supplemental oxygen?  no  Have you had a stroke or heart attack within the last 6 months? no  Do you take weight loss medication?  no  Do you take any blood-thinning medications such as: (aspirin, warfarin, Plavix, Aggrenox)  no  If yes we need the name, milligram, dosage and who is prescribing doctor  No current outpatient medications on file prior to visit.   No current facility-administered medications on file prior to visit.    Allergies  Allergen Reactions   Penicillins Hives and Rash   Morphine Nausea And Vomiting   Codeine Nausea And Vomiting   Morphine And Codeine Nausea And Vomiting     Pharmacy: [facility])  Primary Insurance Name: [employer] number where you can be reached: [phone]

## 2023-07-21 NOTE — Telephone Encounter (Signed)
Ok to schedule.  Room 3  Thanks,  Vista Lawman, MD Gastroenterology and Hepatology [facility]

## 2023-07-29 ENCOUNTER — Telehealth (INDEPENDENT_AMBULATORY_CARE_PROVIDER_SITE_OTHER): Payer: Self-pay | Admitting: *Deleted

## 2023-07-29 NOTE — Telephone Encounter (Signed)
Patient left message to scheduled his TCS - please call [phone]

## 2023-08-05 NOTE — Telephone Encounter (Signed)
Left message to return call 

## 2023-08-06 MED ORDER — PEG 3350-KCL-NA BICARB-NACL 420 G PO SOLR: 4000.0000 mL | Freq: Once | ORAL | 0 refills | Status: AC

## 2023-08-06 NOTE — Addendum Note (Signed)
Addended by: Marlowe Shores on: 08/06/2023 10:43 AM   Modules accepted: Orders

## 2023-08-06 NOTE — Telephone Encounter (Signed)
Left message to return call 

## 2023-08-06 NOTE — Telephone Encounter (Signed)
Pt left message with cell phone number to call to schedule. Returned call to patient; phone rang 4 times and then message came up "I'm sorry your call can not be completed at this time"

## 2023-08-06 NOTE — Telephone Encounter (Signed)
Pt returned call. Pt scheduled for 09/04/23 with Dr.Ahmed. Prep sent to pharmacy. Will send instructions via mail once pre op received. No pa needed per insurance.

## 2023-08-08 ENCOUNTER — Ambulatory Visit (INDEPENDENT_AMBULATORY_CARE_PROVIDER_SITE_OTHER): Payer: 59

## 2023-08-08 DIAGNOSIS — I495 Sick sinus syndrome: Secondary | ICD-10-CM

## 2023-08-08 LAB — CUP PACEART REMOTE DEVICE CHECK
Battery Voltage: 35
Date Time Interrogation Session: 20241025074820
Implantable Lead Connection Status: 753985
Implantable Lead Connection Status: 753985
Implantable Lead Implant Date: 20141111
Implantable Lead Implant Date: 20141111
Implantable Lead Location: 753859
Implantable Lead Location: 753860
Implantable Lead Model: 350
Implantable Lead Model: 350
Implantable Lead Serial Number: 29446968
Implantable Lead Serial Number: 29497144
Implantable Pulse Generator Implant Date: 20141111
Pulse Gen Serial Number: 68069787

## 2023-08-25 NOTE — Progress Notes (Signed)
Remote pacemaker transmission.   

## 2023-08-28 ENCOUNTER — Encounter (HOSPITAL_COMMUNITY)
Admission: RE | Admit: 2023-08-28 | Discharge: 2023-08-28 | Disposition: A | Discharge status: Home / Self Care | Payer: 59 | Source: Ambulatory Visit | Attending: Gastroenterology | Admitting: Gastroenterology

## 2023-08-28 ENCOUNTER — Other Ambulatory Visit: Payer: Self-pay

## 2023-08-28 ENCOUNTER — Encounter (HOSPITAL_COMMUNITY): Payer: Self-pay

## 2023-09-04 ENCOUNTER — Ambulatory Visit (HOSPITAL_COMMUNITY): Payer: Self-pay | Admitting: Anesthesiology

## 2023-09-04 ENCOUNTER — Encounter (HOSPITAL_COMMUNITY)
Admission: RE | Disposition: A | Discharge status: Home / Self Care | Payer: Self-pay | Source: Home / Self Care | Attending: Gastroenterology

## 2023-09-04 ENCOUNTER — Ambulatory Visit (HOSPITAL_COMMUNITY)
Admission: RE | Admit: 2023-09-04 | Discharge: 2023-09-04 | Disposition: A | Discharge status: Home / Self Care | Payer: 59 | Attending: Gastroenterology | Admitting: Gastroenterology

## 2023-09-04 ENCOUNTER — Encounter (INDEPENDENT_AMBULATORY_CARE_PROVIDER_SITE_OTHER): Payer: Self-pay | Admitting: *Deleted

## 2023-09-04 ENCOUNTER — Ambulatory Visit (HOSPITAL_BASED_OUTPATIENT_CLINIC_OR_DEPARTMENT_OTHER): Payer: 59 | Admitting: Anesthesiology

## 2023-09-04 DIAGNOSIS — K5730 Diverticulosis of large intestine without perforation or abscess without bleeding: Secondary | ICD-10-CM | POA: Insufficient documentation

## 2023-09-04 DIAGNOSIS — Z1211 Encounter for screening for malignant neoplasm of colon: Secondary | ICD-10-CM | POA: Insufficient documentation

## 2023-09-04 DIAGNOSIS — K644 Residual hemorrhoidal skin tags: Secondary | ICD-10-CM | POA: Diagnosis not present

## 2023-09-04 DIAGNOSIS — D125 Benign neoplasm of sigmoid colon: Secondary | ICD-10-CM | POA: Diagnosis not present

## 2023-09-04 DIAGNOSIS — D126 Benign neoplasm of colon, unspecified: Secondary | ICD-10-CM | POA: Diagnosis not present

## 2023-09-04 DIAGNOSIS — Z950 Presence of cardiac pacemaker: Secondary | ICD-10-CM | POA: Diagnosis not present

## 2023-09-04 DIAGNOSIS — K648 Other hemorrhoids: Secondary | ICD-10-CM | POA: Insufficient documentation

## 2023-09-04 HISTORY — PX: POLYPECTOMY: SHX5525

## 2023-09-04 HISTORY — PX: COLONOSCOPY WITH PROPOFOL: SHX5780

## 2023-09-04 LAB — HM COLONOSCOPY

## 2023-09-04 SURGERY — COLONOSCOPY WITH PROPOFOL
Anesthesia: General

## 2023-09-04 MED ORDER — LIDOCAINE HCL (CARDIAC) PF 100 MG/5ML IV SOSY
PREFILLED_SYRINGE | INTRAVENOUS | Status: DC | PRN
Start: 2023-09-04 — End: 2023-09-04
  Administered 2023-09-04: 60 mg via INTRAVENOUS

## 2023-09-04 MED ORDER — STERILE WATER FOR IRRIGATION IR SOLN
Status: DC | PRN
  Administered 2023-09-04: 100 mL

## 2023-09-04 MED ORDER — LACTATED RINGERS IV SOLN: INTRAVENOUS | Status: DC | PRN

## 2023-09-04 MED ORDER — LACTATED RINGERS IV SOLN: INTRAVENOUS | Status: DC

## 2023-09-04 MED ORDER — PROPOFOL 10 MG/ML IV BOLUS
INTRAVENOUS | Status: DC | PRN
  Administered 2023-09-04: 10 mg via INTRAVENOUS
  Administered 2023-09-04 (×2): 20 mg via INTRAVENOUS
  Administered 2023-09-04: 50 mg via INTRAVENOUS
  Administered 2023-09-04 (×2): 20 mg via INTRAVENOUS
  Administered 2023-09-04: 10 mg via INTRAVENOUS
  Administered 2023-09-04: 20 mg via INTRAVENOUS
  Administered 2023-09-04 (×2): 10 mg via INTRAVENOUS

## 2023-09-04 NOTE — Anesthesia Postprocedure Evaluation (Signed)
Anesthesia Post Note  Patient: John [name]  Procedure(s) Performed: COLONOSCOPY WITH PROPOFOL POLYPECTOMY  Patient location during evaluation: Phase II Anesthesia Type: General Level of consciousness: awake Pain management: pain level controlled Vital Signs Assessment: post-procedure vital signs reviewed and stable Respiratory status: spontaneous breathing and respiratory function stable Cardiovascular status: blood pressure returned to baseline and stable Postop Assessment: no headache and no apparent nausea or vomiting Anesthetic complications: no Comments: Late entry   No notable events documented.   Last Vitals:  Vitals:   09/04/23 0655 09/04/23 0758  BP: 116/78 (!) 143/96  Pulse: 74 76  Resp: 16 11  Temp: 36.6 C 36.4 C  SpO2: 97% 100%    Last Pain:  Vitals:   09/04/23 0804  TempSrc:   PainSc: 0-No pain                 Windell Norfolk

## 2023-09-04 NOTE — Discharge Instructions (Signed)

## 2023-09-04 NOTE — Anesthesia Preprocedure Evaluation (Signed)
Anesthesia Evaluation  Patient identified by MRN, date of birth, ID band Patient awake    Reviewed: Allergy & Precautions, H&P , NPO status , Patient's Chart, lab work & pertinent test results, reviewed documented beta blocker date and time   History of Anesthesia Complications (+) Family history of anesthesia reaction  Airway Mallampati: II  TM Distance: >3 FB Neck ROM: full    Dental no notable dental hx.    Pulmonary neg pulmonary ROS   Pulmonary exam normal breath sounds clear to auscultation       Cardiovascular Exercise Tolerance: Good hypertension, + pacemaker  Rhythm:regular Rate:Normal     Neuro/Psych Seizures -,   negative psych ROS   GI/Hepatic negative GI ROS, Neg liver ROS,,,  Endo/Other  negative endocrine ROS    Renal/GU Renal disease  negative genitourinary   Musculoskeletal   Abdominal   Peds  Hematology negative hematology ROS (+)   Anesthesia Other Findings   Reproductive/Obstetrics negative OB ROS                             Anesthesia Physical Anesthesia Plan  ASA: 2  Anesthesia Plan: General   Post-op Pain Management:    Induction:   PONV Risk Score and Plan: Propofol infusion  Airway Management Planned:   Additional Equipment:   Intra-op Plan:   Post-operative Plan:   Informed Consent: I have reviewed the patients History and Physical, chart, labs and discussed the procedure including the risks, benefits and alternatives for the proposed anesthesia with the patient or authorized representative who has indicated his/her understanding and acceptance.     Dental Advisory Given  Plan Discussed with: CRNA  Anesthesia Plan Comments:        Anesthesia Quick Evaluation

## 2023-09-04 NOTE — H&P (Signed)
Primary Care Physician:  Estanislado Pandy, MD Primary Gastroenterologist:  Dr. Tasia Catchings  Pre-Procedure History & Physical: HPI:  John [name] is a 57 y.o. male is here for a colonoscopy for colon cancer screening purposes.  Patient denies any family history of colorectal cancer.  No melena or hematochezia.  No abdominal pain or unintentional weight loss.  No change in bowel habits.  Overall feels well from a GI standpoint.  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   Sinus bradycardia 08/12/2013   Syncope     Past Surgical History:  Procedure Laterality Date   BACK SURGERY  1992-94   ruptured disc   EXTRACORPOREAL SHOCK WAVE LITHOTRIPSY Right 04/08/2019   Procedure: EXTRACORPOREAL SHOCK WAVE LITHOTRIPSY (ESWL);  Surgeon: Bjorn Pippin, MD;  Location: [facility];  Service: Urology;  Laterality: Right;   FLEXOR TENOTOMY  Right 06/05/2022   Procedure: FLEXOR TENOTOMY OF RIGHT FOURTH TOE;  Surgeon: Erskine Emery, DPM;  Location: [facility];  Service: Podiatry;  Laterality: Right;   LEFT HEART CATHETERIZATION WITH CORONARY ANGIOGRAM N/A 04/08/2014   Procedure: LEFT HEART CATHETERIZATION WITH CORONARY ANGIOGRAM;  Surgeon: Lesleigh Noe, MD;  Location: [facility] CATH LAB;  Service: Cardiovascular;  Laterality: N/A;   LUMBAR DISC SURGERY  1992; 1994   "disc ruptured twice" (08/24/2013)   PACEMAKER INSERTION  08/24/2013   Biotronik dual chamber PPM implanted by Dr Johney Frame   PERMANENT PACEMAKER INSERTION N/A 08/24/2013   Procedure: PERMANENT PACEMAKER INSERTION;  Surgeon: Gardiner Rhyme, MD;  Location: [facility] CATH LAB;  Service: Cardiovascular;  Laterality: N/A;   TOE ARTHROPLASTY  Right 06/05/2022   Procedure: ARTHROPLASTY OF RIGHT FIFTH TOE;  Surgeon: Erskine Emery, DPM;  Location: [facility];  Service: Podiatry;  Laterality: Right;   TONSILLECTOMY AND ADENOIDECTOMY  ~ 1977    Prior to Admission medications   Not on File    Allergies as of 08/06/2023 - Review Complete 07/18/2023  Allergen Reaction Noted   Penicillins Hives and Rash 07/07/2013   Morphine Nausea And Vomiting 12/10/2022   Codeine Nausea And Vomiting 08/24/2013   Morphine and codeine Nausea And Vomiting 08/24/2013    Family History  Problem Relation Age of Onset   Diabetes Father    Diabetes Mother     Social History   Socioeconomic History   Marital status: Married    Spouse name: [name]   Number of children: 2   Years of education: 12   Highest education level: Not on file  Occupational History   Occupation: Administrator, [employer] Service    Comment: first shift  Tobacco Use   Smoking status: Never   Smokeless tobacco: Never  Vaping Use   Vaping status: Never Used  Substance and Sexual Activity   Alcohol use: Not Currently    Alcohol/week: 0.0 standard drinks of alcohol    Comment: 08/24/2013 "maybe couple drinks/year"   Drug use: No   Sexual activity: Yes  Other Topics Concern   Not on file  Social History Narrative   Lives with wife   Social Determinants of Health  Financial Resource Strain: Not on file  Food Insecurity: Not on file  Transportation Needs: Not on file  Physical Activity: Not on file  Stress: Not on file  Social Connections: Unknown (02/25/2022)   Received from [facility], [facility]   Social Network    Social Network: Not on file  Intimate Partner Violence: Unknown (01/17/2022)   Received from [facility], [facility]   HITS    Physically Hurt: Not on file    Insult or Talk Down To: Not on file    Threaten Physical Harm: Not on file    Scream or Curse: Not on file    Review of Systems: See HPI, otherwise negative ROS  Physical Exam: Vital  signs in last 24 hours: Temp:  [97.8 F (36.6 C)] 97.8 F (36.6 C) (11/21 0655) Pulse Rate:  [74] 74 (11/21 0655) Resp:  [16] 16 (11/21 0655) BP: (116)/(78) 116/78 (11/21 0655) SpO2:  [97 %] 97 % (11/21 0655) Weight:  [75.8 kg] 75.8 kg (11/21 0655)   General:   Alert,  Well-developed, well-nourished, pleasant and cooperative in NAD Head:  Normocephalic and atraumatic. Eyes:  Sclera clear, no icterus.   Conjunctiva pink. Ears:  Normal auditory acuity. Nose:  No deformity, discharge,  or lesions. Msk:  Symmetrical without gross deformities. Normal posture. Extremities:  Without clubbing or edema. Neurologic:  Alert and  oriented x4;  grossly normal neurologically. Skin:  Intact without significant lesions or rashes. Psych:  Alert and cooperative. Normal mood and affect.  Impression/Plan: John [name] is here for a colonoscopy to be performed for colon cancer screening purposes.  The risks of the procedure including infection, bleed, or perforation as well as benefits, limitations, alternatives and imponderables have been reviewed with the patient. Questions have been answered. All parties agreeable.

## 2023-09-04 NOTE — Transfer of Care (Signed)
Immediate Anesthesia Transfer of Care Note  Patient: Aadesh Granados Slager  Procedure(s) Performed: COLONOSCOPY WITH PROPOFOL POLYPECTOMY  Patient Location: PACU  Anesthesia Type:General  Level of Consciousness: drowsy and patient cooperative  Airway & Oxygen Therapy: Patient Spontanous Breathing and Patient connected to nasal cannula oxygen  Post-op Assessment: Report given to RN, Post -op Vital signs reviewed and stable, and Patient moving all extremities X 4  Post vital signs: Reviewed and stable  Last Vitals:  Vitals Value Taken Time  BP 143/96 09/04/23 0758  Temp 36.4 C 09/04/23 0758  Pulse 76 09/04/23 0758  Resp 11 09/04/23 0758  SpO2 100 % 09/04/23 0758    Last Pain:  Vitals:   09/04/23 0758  TempSrc: Axillary  PainSc: 0-No pain      Patients Stated Pain Goal: 5 (09/04/23 0655)  Complications: No notable events documented.

## 2023-09-04 NOTE — Op Note (Signed)
[facility] Patient Name: John [name] Procedure Date: 09/04/2023 7:10 AM MRN: [id number] Date of Birth: 12-23-1965 Attending MD: Sanjuan Dame , MD, [id number] CSN: [id number] Age: 57 Admit Type: Outpatient Procedure:                Colonoscopy Indications:              Screening for colorectal malignant neoplasm Providers:                Sanjuan Dame, MD, Angelica Ran, Francoise Ceo RN, RN,                            Elinor Parkinson Referring MD:              Medicines:                Monitored Anesthesia Care Complications:            No immediate complications. Estimated Blood Loss:     Estimated blood loss was minimal. Procedure:                Pre-Anesthesia Assessment:                           - Prior to the procedure, a History and Physical                            was performed, and patient medications and                            allergies were reviewed. The patient's tolerance of                            previous anesthesia was also reviewed. The risks                            and benefits of the procedure and the sedation                            options and risks were discussed with the patient.                            All questions were answered, and informed consent                            was obtained. Prior Anticoagulants: The patient has                            taken no anticoagulant or antiplatelet agents. ASA                            Grade Assessment: II - A patient with mild systemic                            disease. After reviewing the risks and benefits,  the patient was deemed in satisfactory condition to                            undergo the procedure.                           After obtaining informed consent, the colonoscope                            was passed under direct vision. Throughout the                            procedure, the patient's blood pressure, pulse, and                             oxygen saturations were monitored continuously. The                            [id number]) scope was introduced through the                            anus and advanced to the the cecum, identified by                            appendiceal orifice and ileocecal valve. The                            colonoscopy was performed without difficulty. The                            patient tolerated the procedure well. The quality                            of the bowel preparation was evaluated using the                            BBPS [facility] Bowel Preparation Scale) with scores                            of: Right Colon = 3, Transverse Colon = 3 and Left                            Colon = 3 (entire mucosa seen well with no residual                            staining, small fragments of stool or opaque                            liquid). The total BBPS score equals 9. The                            ileocecal valve, appendiceal orifice, and rectum  were photographed. Scope In: 7:32:59 AM Scope Out: 7:54:50 AM Scope Withdrawal Time: 0 hours 20 minutes 8 seconds  Total Procedure Duration: 0 hours 21 minutes 51 seconds  Findings:      The perianal and digital rectal examinations were normal.      Three sessile polyps were found in the sigmoid colon. The polyps were 3       to 6 mm in size. These polyps were removed with a cold snare. Resection       and retrieval were complete.      Scattered diverticula were found in the left colon.      Non-bleeding external and internal hemorrhoids were found during       retroflexion. The hemorrhoids were small. Impression:               - Three 3 to 6 mm polyps in the sigmoid colon,                            removed with a cold snare. Resected and retrieved.                           - Diverticulosis in the left colon.                           - Non-bleeding external and internal hemorrhoids. Moderate Sedation:      Per  Anesthesia Care Recommendation:           - Patient has a contact number available for                            emergencies. The signs and symptoms of potential                            delayed complications were discussed with the                            patient. Return to normal activities tomorrow.                            Written discharge instructions were provided to the                            patient.                           - Resume previous diet.                           - Continue present medications.                           - Await pathology results.                           - Repeat colonoscopy in 7-10 years for screening                            purposes.                           -  Return to primary care physician as previously                            scheduled. Procedure Code(s):        --- Professional ---                           [phone], Colonoscopy, flexible; with removal of                            tumor(s), polyp(s), or other lesion(s) by snare                            technique Diagnosis Code(s):        --- Professional ---                           Z12.11, Encounter for screening for malignant                            neoplasm of colon                           D12.5, Benign neoplasm of sigmoid colon                           K64.8, Other hemorrhoids                           K57.30, Diverticulosis of large intestine without                            perforation or abscess without bleeding CPT copyright 2022 American Medical Association. All rights reserved. The codes documented in this report are preliminary and upon coder review may  be revised to meet current compliance requirements. Sanjuan Dame, MD Sanjuan Dame, MD 09/04/2023 8:00:10 AM This report has been signed electronically. Number of Addenda: 0

## 2023-09-05 LAB — SURGICAL PATHOLOGY

## 2023-09-08 NOTE — Progress Notes (Signed)
I reviewed the pathology results. Ann, can you send her a letter with the findings as described below please? Repeat colonoscopy in 5 years  Thanks,  Vista Lawman, MD Gastroenterology and Hepatology [facility]  ---------------------------------------------------------------------------------------------  [facility] [address], [address], [place], [place] [address] Phone:  [phone]   09/08/23 [place], [place]   Dear John [name],  I am writing to inform you that the biopsies taken during your recent endoscopic examination showed:  I am writing to let you know the results of your recent colonoscopy.  You had a total of 3 polyps removed. 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  Given these findings, it is recommended that your next colonoscopy be performed in 5 years.  Please call us at [phone] if you have persistent problems or have questions about your condition that have not been fully answered at this time.  Sincerely,  Vista Lawman, MD Gastroenterology and Hepatology

## 2023-09-09 ENCOUNTER — Encounter (INDEPENDENT_AMBULATORY_CARE_PROVIDER_SITE_OTHER): Payer: Self-pay | Admitting: *Deleted

## 2023-09-10 ENCOUNTER — Encounter (HOSPITAL_COMMUNITY): Payer: Self-pay | Admitting: Gastroenterology

## 2023-11-07 ENCOUNTER — Ambulatory Visit (INDEPENDENT_AMBULATORY_CARE_PROVIDER_SITE_OTHER): Payer: Managed Care, Other (non HMO)

## 2023-11-07 ENCOUNTER — Encounter: Payer: Self-pay | Admitting: Internal Medicine

## 2023-11-07 DIAGNOSIS — I495 Sick sinus syndrome: Secondary | ICD-10-CM | POA: Diagnosis not present

## 2023-11-07 LAB — CUP PACEART REMOTE DEVICE CHECK
Date Time Interrogation Session: 20250124085839
Implantable Lead Connection Status: 753985
Implantable Lead Connection Status: 753985
Implantable Lead Implant Date: 20141111
Implantable Lead Implant Date: 20141111
Implantable Lead Location: 753859
Implantable Lead Location: 753860
Implantable Lead Model: 350
Implantable Lead Model: 350
Implantable Lead Serial Number: 29446968
Implantable Lead Serial Number: 29497144
Implantable Pulse Generator Implant Date: 20141111
Pulse Gen Serial Number: 68069787

## 2023-12-10 ENCOUNTER — Ambulatory Visit: Payer: 59 | Attending: Internal Medicine | Admitting: Internal Medicine

## 2023-12-10 ENCOUNTER — Encounter: Payer: Self-pay | Admitting: Internal Medicine

## 2023-12-10 VITALS — BP 124/76 | HR 80 | Ht 69.0 in | Wt 160.4 lb

## 2023-12-10 DIAGNOSIS — I495 Sick sinus syndrome: Secondary | ICD-10-CM

## 2023-12-10 NOTE — Patient Instructions (Signed)

## 2023-12-10 NOTE — Progress Notes (Signed)
 HPI [name] returns today for ongoing evaluation and management of his PPM and sinus node dysfunction. John [name] Kitchen He is a pleasant 58 yo man with a h/o sinus node dysfunction who underwent PPM insertion 10 years ago. In the interim he has done well. He has a h/o seizures but none in 3 years.  He is working over 50 hours a week, 6 days at a time. He has no limit to his activity.  Allergies  Allergen Reactions   Penicillins Hives and Rash   Morphine Nausea And Vomiting   Codeine Nausea And Vomiting   Morphine And Codeine Nausea And Vomiting     No current outpatient medications on file.   No current facility-administered medications for this visit.     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   Past Surgical History:  Procedure Laterality Date   BACK SURGERY  1992-94   ruptured disc   COLONOSCOPY WITH PROPOFOL N/A 09/04/2023   Procedure: COLONOSCOPY WITH PROPOFOL;  Surgeon: John Macho, MD;  Location: [facility];  Service: Endoscopy;  Laterality: N/A;  7:30AM;ASA 3   EXTRACORPOREAL SHOCK WAVE LITHOTRIPSY Right 04/08/2019   Procedure: EXTRACORPOREAL SHOCK WAVE LITHOTRIPSY (ESWL);  Surgeon: John Pippin, MD;  Location: [facility];  Service: Urology;  Laterality: Right;   FLEXOR TENOTOMY  Right 06/05/2022   Procedure: FLEXOR TENOTOMY OF RIGHT FOURTH TOE;  Surgeon: John [name], DPM;  Location: [facility];  Service: Podiatry;  Laterality: Right;   LEFT HEART CATHETERIZATION WITH CORONARY ANGIOGRAM N/A 04/08/2014   Procedure: LEFT HEART  CATHETERIZATION WITH CORONARY ANGIOGRAM;  Surgeon: John Noe, MD;  Location: [facility] CATH LAB;  Service: Cardiovascular;  Laterality: N/A;   LUMBAR DISC SURGERY  1992; 1994   "disc ruptured twice" (08/24/2013)   PACEMAKER INSERTION  08/24/2013   Biotronik dual chamber PPM implanted by Dr John [name]   PERMANENT PACEMAKER INSERTION N/A 08/24/2013   Procedure: PERMANENT PACEMAKER INSERTION;  Surgeon: John Rhyme, MD;  Location: [facility] CATH LAB;  Service: Cardiovascular;  Laterality: N/A;   POLYPECTOMY  09/04/2023   Procedure: POLYPECTOMY;  Surgeon: John Macho, MD;  Location: [facility];  Service: Endoscopy;;   TOE ARTHROPLASTY Right 06/05/2022   Procedure: ARTHROPLASTY OF RIGHT FIFTH TOE;  Surgeon: John [name], DPM;  Location: [facility];  Service: Podiatry;  Laterality: Right;   TONSILLECTOMY AND ADENOIDECTOMY  ~ 1977     Family History  Problem Relation Age of Onset   Diabetes Father    Diabetes Mother      Social History   Socioeconomic History   Marital status: Married    Spouse name: John [name]   Number of children: 2   Years of education: 12   Highest education level: Not on file  Occupational History   Occupation: Administrator, [employer]    Comment: first shift  Tobacco Use   Smoking status: Never  Smokeless tobacco: Never  Vaping Use   Vaping status: Never Used  Substance and Sexual Activity   Alcohol use: Not Currently    Alcohol/week: 0.0 standard drinks of alcohol    Comment: 08/24/2013 "maybe couple drinks/year"   Drug use: No   Sexual activity: Yes  Other Topics Concern   Not on file  Social History Narrative   Lives with wife   Social Drivers of Corporate investment banker Strain: Not on file  Food Insecurity: Not on file  Transportation Needs: Not on file  Physical Activity: Not on file  Stress: Not on file  Social Connections: Unknown (02/25/2022)   Received from [facility], [facility]   Social Network    Social Network: Not on file   Intimate Partner Violence: Unknown (01/17/2022)   Received from [employer] [facility], [facility]   HITS    Physically Hurt: Not on file    Insult or Talk Down To: Not on file    Threaten Physical Harm: Not on file    Scream or Curse: Not on file     Ht 5\' 9"  (1.753 m)   Wt 160 lb 6.4 oz (72.8 kg)   BMI 23.69 kg/m   Physical Exam:  Well appearing NAD HEENT: Unremarkable Neck:  No JVD, no thyromegally Lymphatics:  No adenopathy Back:  No CVA tenderness Lungs:  Clear with no wheezes HEART:  Regular rate rhythm, no murmurs, no rubs, no clicks Abd:  soft, positive bowel sounds, no organomegally, no rebound, no guarding Ext:  2 plus pulses, no edema, no cyanosis, no clubbing Skin:  No rashes no nodules Neuro:  CN II through XII intact, motor grossly intact  EKG - nsr with atrial pacing  DEVICE  Normal device function.  See PaceArt for details.   Assess/Plan:  Sinus node dysfunction - he is asymptomatic s/p PPM insertion. 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 We will recheck in several months.3 years to ERI. Seizures - he has not had any in 4 years. His PM is not MRI compatible. He appears to have had his Keppra stopped.   John [name] John Dorrance,MD

## 2023-12-15 LAB — CUP PACEART INCLINIC DEVICE CHECK
Brady Statistic RA Percent Paced: 65 %
Brady Statistic RV Percent Paced: 0 %
Date Time Interrogation Session: 20250226212411
Implantable Lead Connection Status: 753985
Implantable Lead Connection Status: 753985
Implantable Lead Implant Date: 20141111
Implantable Lead Implant Date: 20141111
Implantable Lead Location: 753859
Implantable Lead Location: 753860
Implantable Lead Model: 350
Implantable Lead Model: 350
Implantable Lead Serial Number: 29446968
Implantable Lead Serial Number: 29497144
Implantable Pulse Generator Implant Date: 20141111
Lead Channel Impedance Value: 468 Ohm
Lead Channel Impedance Value: 604 Ohm
Lead Channel Pacing Threshold Amplitude: 0.5 V
Lead Channel Sensing Intrinsic Amplitude: 10.8 mV
Lead Channel Sensing Intrinsic Amplitude: 2.3 mV
Pulse Gen Serial Number: 68069787

## 2023-12-16 NOTE — Progress Notes (Signed)
 Remote pacemaker transmission.

## 2024-02-06 ENCOUNTER — Ambulatory Visit (INDEPENDENT_AMBULATORY_CARE_PROVIDER_SITE_OTHER): Payer: Managed Care, Other (non HMO)

## 2024-02-06 DIAGNOSIS — I495 Sick sinus syndrome: Secondary | ICD-10-CM | POA: Diagnosis not present

## 2024-02-06 LAB — CUP PACEART REMOTE DEVICE CHECK
Date Time Interrogation Session: 20250425081157
Implantable Lead Connection Status: 753985
Implantable Lead Connection Status: 753985
Implantable Lead Implant Date: 20141111
Implantable Lead Implant Date: 20141111
Implantable Lead Location: 753859
Implantable Lead Location: 753860
Implantable Lead Model: 350
Implantable Lead Model: 350
Implantable Lead Serial Number: 29446968
Implantable Lead Serial Number: 29497144
Implantable Pulse Generator Implant Date: 20141111
Pulse Gen Serial Number: 68069787

## 2024-02-10 ENCOUNTER — Encounter: Payer: Self-pay | Admitting: Internal Medicine

## 2024-03-15 NOTE — Progress Notes (Signed)
 Remote pacemaker transmission.

## 2024-05-07 ENCOUNTER — Ambulatory Visit (INDEPENDENT_AMBULATORY_CARE_PROVIDER_SITE_OTHER): Payer: Managed Care, Other (non HMO)

## 2024-05-07 DIAGNOSIS — I495 Sick sinus syndrome: Secondary | ICD-10-CM | POA: Diagnosis not present

## 2024-05-07 LAB — CUP PACEART REMOTE DEVICE CHECK
Battery Voltage: 35
Date Time Interrogation Session: 20250725092038
Implantable Lead Connection Status: 753985
Implantable Lead Connection Status: 753985
Implantable Lead Implant Date: 20141111
Implantable Lead Implant Date: 20141111
Implantable Lead Location: 753859
Implantable Lead Location: 753860
Implantable Lead Model: 350
Implantable Lead Model: 350
Implantable Lead Serial Number: 29446968
Implantable Lead Serial Number: 29497144
Implantable Pulse Generator Implant Date: 20141111
Pulse Gen Serial Number: 68069787

## 2024-05-10 ENCOUNTER — Ambulatory Visit: Payer: Self-pay | Admitting: Internal Medicine

## 2024-07-15 NOTE — Progress Notes (Signed)
 Remote PPM Transmission

## 2024-08-06 ENCOUNTER — Ambulatory Visit (INDEPENDENT_AMBULATORY_CARE_PROVIDER_SITE_OTHER)

## 2024-08-06 DIAGNOSIS — I495 Sick sinus syndrome: Secondary | ICD-10-CM | POA: Diagnosis not present

## 2024-08-07 LAB — CUP PACEART REMOTE DEVICE CHECK
Date Time Interrogation Session: 20251024125154
Implantable Lead Connection Status: 753985
Implantable Lead Connection Status: 753985
Implantable Lead Implant Date: 20141111
Implantable Lead Implant Date: 20141111
Implantable Lead Location: 753859
Implantable Lead Location: 753860
Implantable Lead Model: 350
Implantable Lead Model: 350
Implantable Lead Serial Number: 29446968
Implantable Lead Serial Number: 29497144
Implantable Pulse Generator Implant Date: 20141111
Pulse Gen Serial Number: 68069787

## 2024-08-09 NOTE — Progress Notes (Signed)
 Remote PPM Transmission

## 2024-08-12 ENCOUNTER — Ambulatory Visit: Payer: Self-pay | Admitting: Internal Medicine

## 2024-11-05 ENCOUNTER — Ambulatory Visit

## 2024-11-05 DIAGNOSIS — I495 Sick sinus syndrome: Secondary | ICD-10-CM | POA: Diagnosis not present

## 2024-11-05 LAB — CUP PACEART REMOTE DEVICE CHECK
Battery Voltage: 30
Date Time Interrogation Session: 20260123050308
Implantable Lead Connection Status: 753985
Implantable Lead Connection Status: 753985
Implantable Lead Implant Date: 20141111
Implantable Lead Implant Date: 20141111
Implantable Lead Location: 753859
Implantable Lead Location: 753860
Implantable Lead Model: 350
Implantable Lead Model: 350
Implantable Lead Serial Number: 29446968
Implantable Lead Serial Number: 29497144
Implantable Pulse Generator Implant Date: 20141111
Pulse Gen Serial Number: 68069787

## 2024-11-08 NOTE — Progress Notes (Signed)
 Remote PPM Transmission

## 2024-11-09 ENCOUNTER — Ambulatory Visit: Payer: Self-pay | Admitting: Cardiovascular Disease

## 2024-12-10 ENCOUNTER — Ambulatory Visit: Admitting: Student in an Organized Health Care Education/Training Program

## 2024-12-10 ENCOUNTER — Ambulatory Visit: Admitting: Physician Assistant

## 2025-02-04 ENCOUNTER — Ambulatory Visit

## 2025-05-06 ENCOUNTER — Ambulatory Visit

## 2025-08-05 ENCOUNTER — Ambulatory Visit
